# Patient Record
Sex: Female | Born: 1975 | Race: Black or African American | Hispanic: No | Marital: Married | Smoking: Never smoker
Health system: Southern US, Community
[De-identification: ages and names within clinical notes are randomized; demographics above are authoritative.]

## PROBLEM LIST (undated history)

## (undated) DIAGNOSIS — F419 Anxiety disorder, unspecified: Secondary | ICD-10-CM

## (undated) DIAGNOSIS — N644 Mastodynia: Secondary | ICD-10-CM

## (undated) DIAGNOSIS — I8393 Asymptomatic varicose veins of bilateral lower extremities: Secondary | ICD-10-CM

## (undated) DIAGNOSIS — M545 Low back pain, unspecified: Secondary | ICD-10-CM

## (undated) DIAGNOSIS — K219 Gastro-esophageal reflux disease without esophagitis: Secondary | ICD-10-CM

## (undated) DIAGNOSIS — E559 Vitamin D deficiency, unspecified: Secondary | ICD-10-CM

## (undated) HISTORY — DX: Asymptomatic varicose veins of bilateral lower extremities: I83.93

## (undated) HISTORY — DX: Gastro-esophageal reflux disease without esophagitis: K21.9

## (undated) HISTORY — DX: Mastodynia: N64.4

## (undated) HISTORY — DX: Anxiety disorder, unspecified: F41.9

## (undated) HISTORY — DX: Low back pain: M54.5

## (undated) HISTORY — DX: Low back pain, unspecified: M54.50

## (undated) HISTORY — DX: Vitamin D deficiency, unspecified: E55.9

---

## 2005-02-26 ENCOUNTER — Ambulatory Visit: Payer: Self-pay | Admitting: Unknown Physician Specialty

## 2006-02-24 ENCOUNTER — Ambulatory Visit: Payer: Self-pay | Admitting: Family Medicine

## 2006-05-13 ENCOUNTER — Ambulatory Visit (HOSPITAL_COMMUNITY): Admission: RE | Admit: 2006-05-13 | Discharge: 2006-05-13 | Payer: Self-pay | Admitting: Obstetrics

## 2006-10-18 ENCOUNTER — Inpatient Hospital Stay (HOSPITAL_COMMUNITY): Admission: AD | Admit: 2006-10-18 | Discharge: 2006-10-18 | Payer: Self-pay | Admitting: Obstetrics

## 2006-10-23 ENCOUNTER — Inpatient Hospital Stay (HOSPITAL_COMMUNITY): Admission: RE | Admit: 2006-10-23 | Discharge: 2006-10-26 | Payer: Self-pay | Admitting: Obstetrics

## 2007-04-30 IMAGING — US US OB COMP +14 WK
1 series · 13 of 28 positions shown · non-contrast
Comparison: none

CLINICAL DATA: 17 week 4 day gestational age by LMP.  Evaluate dating and anatomy.

[Series 1: us ob comp +14 wk · 0.14mm/px · 13 of 74 slices shown]
[im 3/74]
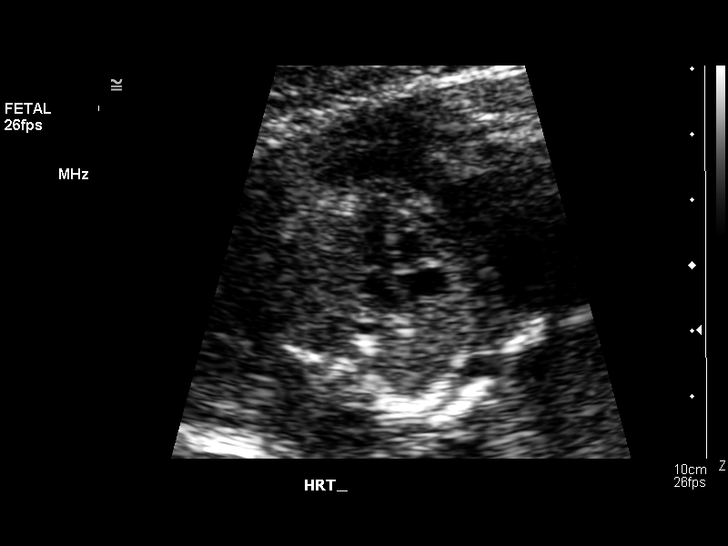
[im 9/74]
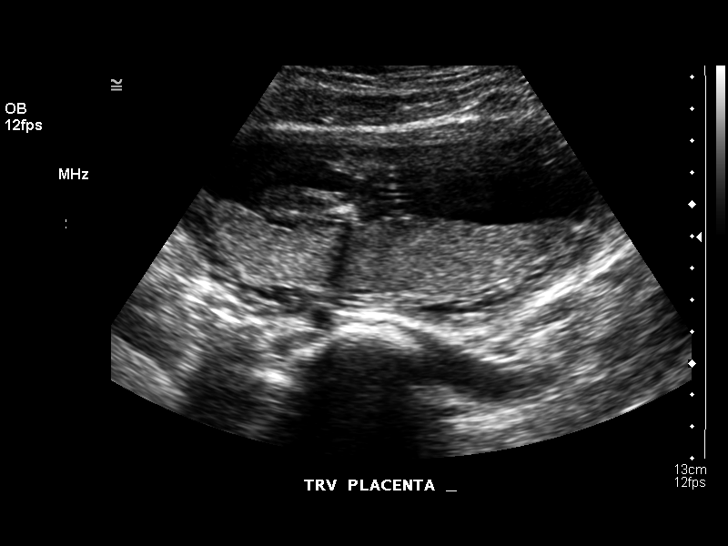
[im 14/74]
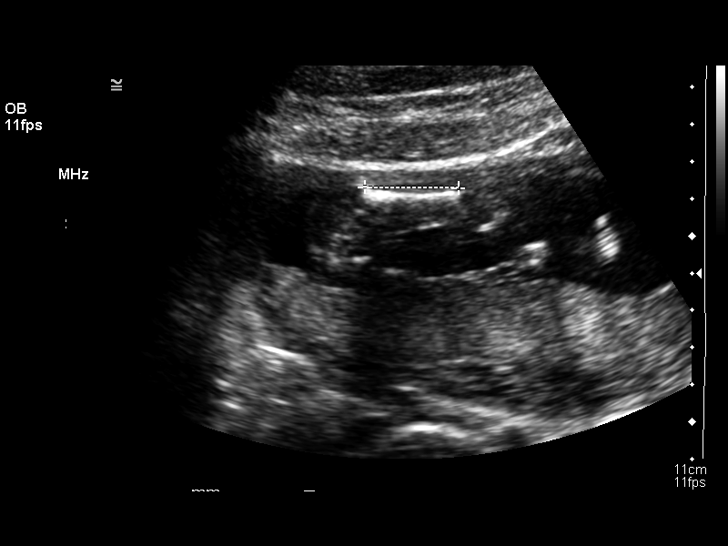
[im 19/74]
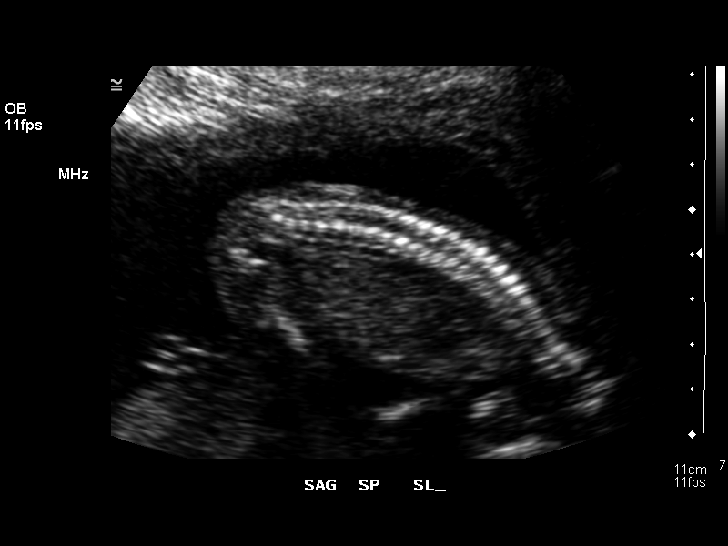
[im 25/74]
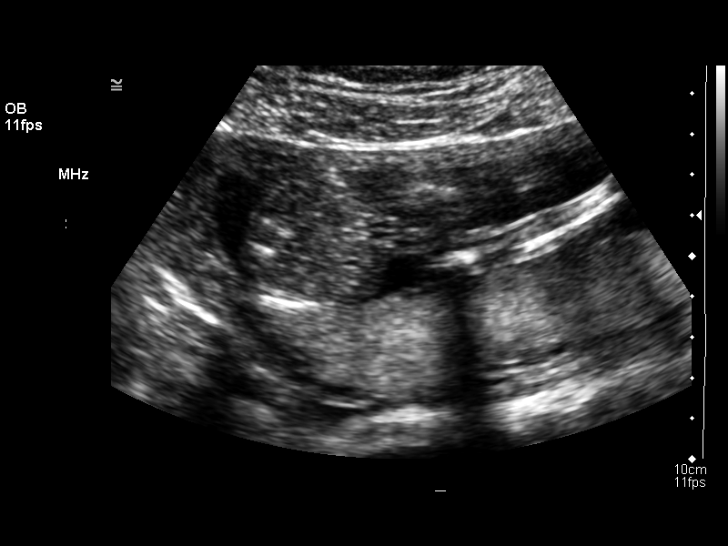
[im 30/74]
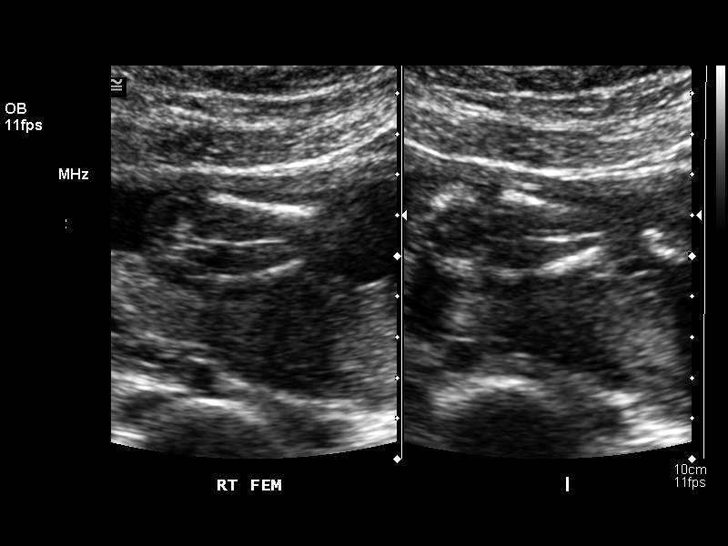
[im 38/74]
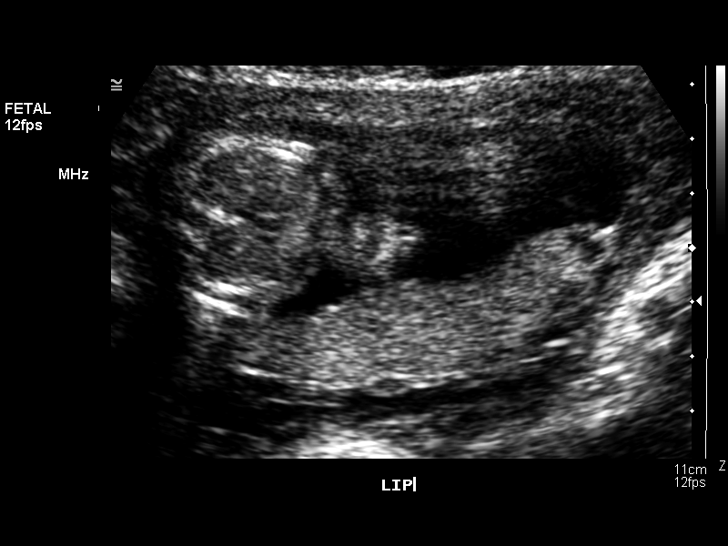
[im 44/74]
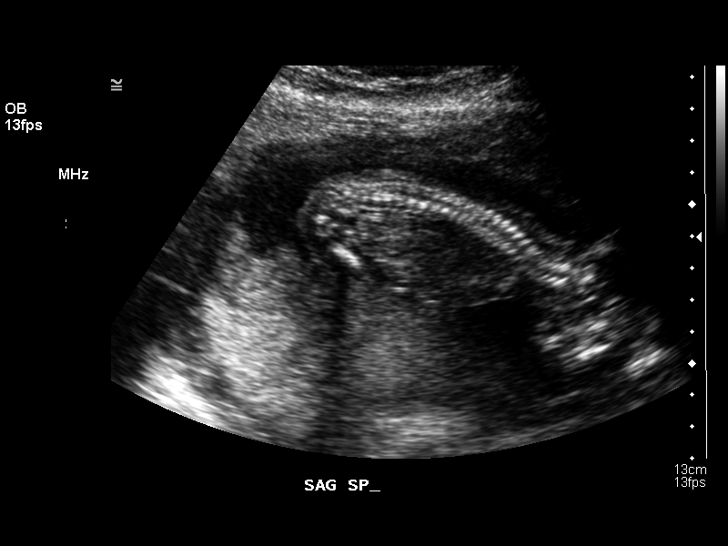
[im 49/74]
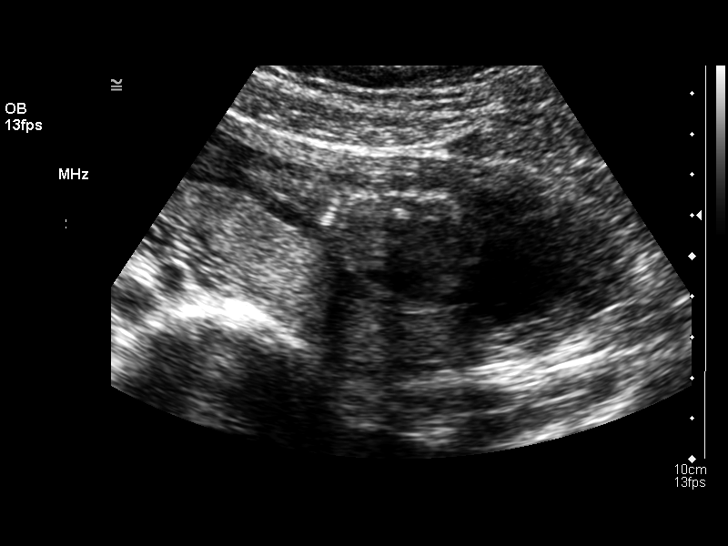
[im 55/74]
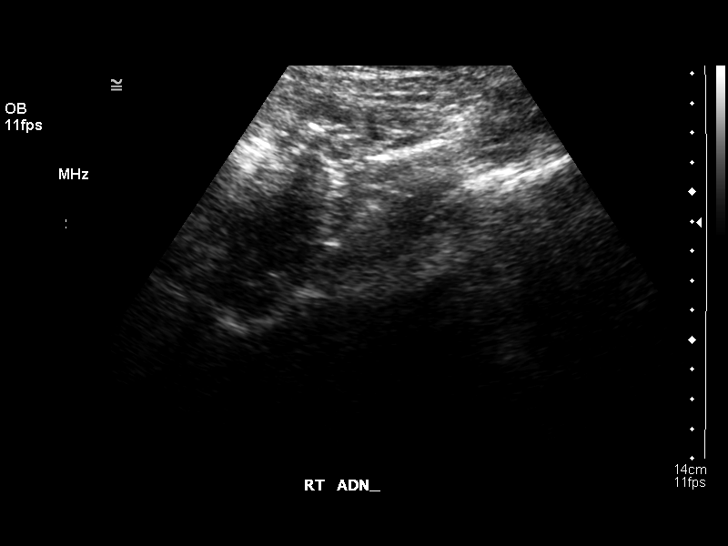
[im 60/74]
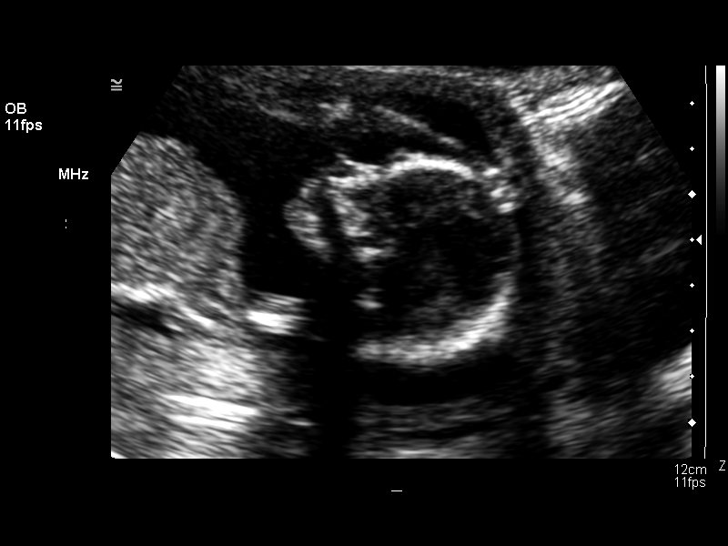
[im 65/74]
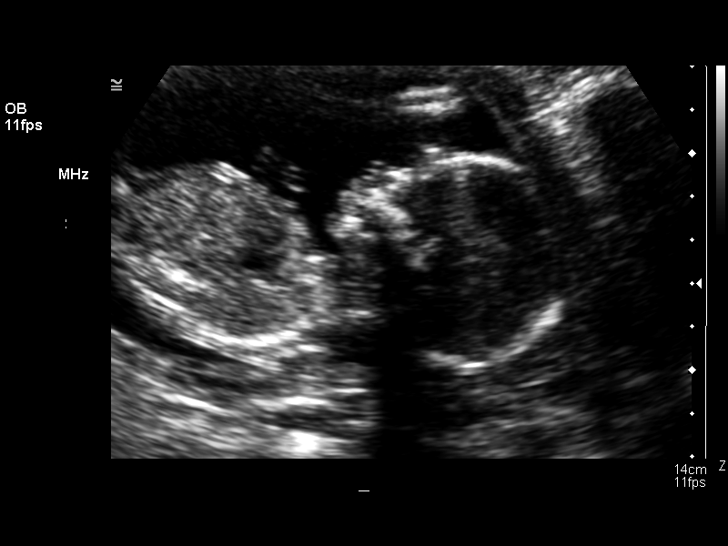
[im 71/74]
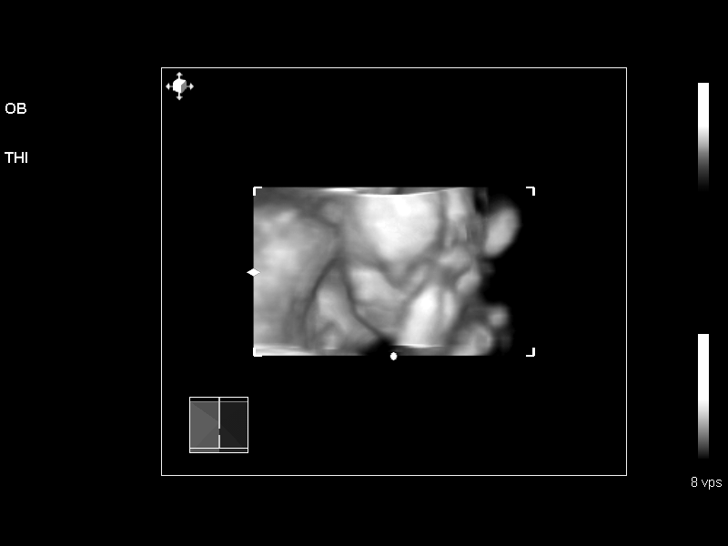

[13 of 28 positions shown; findings below may reference images not displayed]

OBSTETRICAL ULTRASOUND:
 Number of Fetuses: 1
 Heart Rate:  150
 Movement:  Yes
 Breathing:    No  
 Presentation:  Cephalic
 Placental Location:  Posterior
 Grade:  I
 Previa:  No
 Amniotic Fluid (Subjective):  Normal
 Amniotic Fluid (Objective):   3.1 cm Vertical pocket 

 FETAL BIOMETRY
 BPD:   4.0 cm   18 w 0 d
 HC:   15.0 cm   17 w 6 d
 AC:   12.0 cm   17 w 4 d
 FL:    2.6 cm   17 w 6 d

 MEAN GA:  17 w 6 d  US EDC:  10/15/06

 FETAL ANATOMY
 Lateral Ventricles:    Visualized 
 Thalami/CSP:      Visualized 
 Posterior Fossa:  Visualized 
 Nuchal Region: NF= 2.2 mm    Visualized 
 Spine:      Visualized 
 4 Chamber Heart on Left:      Visualized 
 Stomach on Left:      Visualized 
 3 Vessel Cord:    Visualized 
 Cord Insertion site:    Visualized 
 Kidneys:  Visualized 
 Bladder:  Visualized 
 Extremities:      Visualized 

 ADDITIONAL ANATOMY VISUALIZED:  Upper lip, orbits, profile and diaphragm.

 Evaluation limited by: Fetal position.

 MATERNAL UTERINE AND ADNEXAL FINDINGS
 Cervix: 3.2 cm transabdominally
IMPRESSION: 1.  Single living intrauterine fetus with mean gestational age of 17 weeks 6 days and sonographic EDC of 10/15/06.  This is concordant with LMP.  
 2.  No fetal anatomic abnormality identified.

## 2009-04-22 ENCOUNTER — Emergency Department (HOSPITAL_COMMUNITY): Admission: EM | Admit: 2009-04-22 | Discharge: 2009-04-22 | Payer: Self-pay | Admitting: Emergency Medicine

## 2010-09-23 ENCOUNTER — Emergency Department (HOSPITAL_COMMUNITY): Admission: EM | Admit: 2010-09-23 | Discharge: 2010-09-24 | Payer: Self-pay | Admitting: Emergency Medicine

## 2011-01-08 LAB — POCT I-STAT, CHEM 8
BUN: 18 mg/dL (ref 6–23)
Calcium, Ion: 1.19 mmol/L (ref 1.12–1.32)
Chloride: 103 meq/L (ref 96–112)
Creatinine, Ser: 0.8 mg/dL (ref 0.4–1.2)
Glucose, Bld: 93 mg/dL (ref 70–99)
HCT: 40 % (ref 36.0–46.0)
Hemoglobin: 13.6 g/dL (ref 12.0–15.0)
Potassium: 3.3 meq/L — ABNORMAL LOW (ref 3.5–5.1)
Sodium: 142 meq/L (ref 135–145)
TCO2: 27 mmol/L (ref 0–100)

## 2011-01-08 LAB — URINALYSIS, ROUTINE W REFLEX MICROSCOPIC
Bilirubin Urine: NEGATIVE
Ketones, ur: NEGATIVE mg/dL
Leukocytes, UA: NEGATIVE
Nitrite: NEGATIVE
Protein, ur: NEGATIVE mg/dL
Urobilinogen, UA: 1 mg/dL (ref 0.0–1.0)
pH: 6.5 (ref 5.0–8.0)

## 2011-01-08 LAB — GC/CHLAMYDIA PROBE AMP, GENITAL
Chlamydia, DNA Probe: NEGATIVE
GC Probe Amp, Genital: NEGATIVE

## 2011-01-08 LAB — WET PREP, GENITAL
Trich, Wet Prep: NONE SEEN
Yeast Wet Prep HPF POC: NONE SEEN

## 2011-01-08 LAB — POCT PREGNANCY, URINE: Preg Test, Ur: NEGATIVE

## 2011-01-08 LAB — DIFFERENTIAL
Eosinophils Relative: 3 % (ref 0–5)
Lymphocytes Relative: 29 % (ref 12–46)
Lymphs Abs: 2.5 10*3/uL (ref 0.7–4.0)
Monocytes Relative: 7 % (ref 3–12)
Neutrophils Relative %: 61 % (ref 43–77)

## 2011-01-08 LAB — CBC
Hemoglobin: 12 g/dL (ref 12.0–15.0)
MCH: 30.8 pg (ref 26.0–34.0)
MCV: 94.6 fL (ref 78.0–100.0)
Platelets: 240 10*3/uL (ref 150–400)
RBC: 3.9 MIL/uL (ref 3.87–5.11)
WBC: 8.4 10*3/uL (ref 4.0–10.5)

## 2011-01-08 LAB — URINE MICROSCOPIC-ADD ON

## 2011-02-04 LAB — POCT I-STAT, CHEM 8
Calcium, Ion: 1.17 mmol/L (ref 1.12–1.32)
Chloride: 105 mEq/L (ref 96–112)
HCT: 38 % (ref 36.0–46.0)
Potassium: 3.8 mEq/L (ref 3.5–5.1)
Sodium: 140 mEq/L (ref 135–145)

## 2011-02-04 LAB — CBC
MCHC: 33 g/dL (ref 30.0–36.0)
Platelets: 209 10*3/uL (ref 150–400)
RDW: 12.4 % (ref 11.5–15.5)

## 2011-02-04 LAB — DIFFERENTIAL
Basophils Absolute: 0 10*3/uL (ref 0.0–0.1)
Basophils Relative: 0 % (ref 0–1)
Neutro Abs: 3.8 10*3/uL (ref 1.7–7.7)
Neutrophils Relative %: 64 % (ref 43–77)

## 2011-02-04 LAB — POCT CARDIAC MARKERS
CKMB, poc: <1 ng/mL — ABNORMAL LOW (ref 1.0–8.0)
Myoglobin, poc: 36.3 ng/mL (ref 12–200)
Troponin i, poc: <0.05 ng/mL (ref 0.00–0.09)

## 2011-03-20 ENCOUNTER — Ambulatory Visit: Payer: Self-pay | Admitting: Family Medicine

## 2011-04-10 ENCOUNTER — Other Ambulatory Visit (HOSPITAL_COMMUNITY)
Admission: RE | Admit: 2011-04-10 | Discharge: 2011-04-10 | Disposition: A | Discharge status: Home / Self Care | Payer: BC Managed Care – PPO | Source: Ambulatory Visit | Attending: Family Medicine | Admitting: Family Medicine

## 2011-04-10 ENCOUNTER — Encounter: Payer: Self-pay | Admitting: Family Medicine

## 2011-04-10 ENCOUNTER — Ambulatory Visit (INDEPENDENT_AMBULATORY_CARE_PROVIDER_SITE_OTHER): Payer: BC Managed Care – PPO | Admitting: Family Medicine

## 2011-04-10 VITALS — BP 120/72 | HR 72 | Temp 98.6°F | Ht 67.0 in | Wt 162.8 lb

## 2011-04-10 DIAGNOSIS — Z136 Encounter for screening for cardiovascular disorders: Secondary | ICD-10-CM

## 2011-04-10 DIAGNOSIS — Z124 Encounter for screening for malignant neoplasm of cervix: Secondary | ICD-10-CM

## 2011-04-10 DIAGNOSIS — R87810 Cervical high risk human papillomavirus (HPV) DNA test positive: Secondary | ICD-10-CM | POA: Insufficient documentation

## 2011-04-10 DIAGNOSIS — R3129 Other microscopic hematuria: Secondary | ICD-10-CM

## 2011-04-10 DIAGNOSIS — Z113 Encounter for screening for infections with a predominantly sexual mode of transmission: Secondary | ICD-10-CM

## 2011-04-10 DIAGNOSIS — K5900 Constipation, unspecified: Secondary | ICD-10-CM | POA: Insufficient documentation

## 2011-04-10 DIAGNOSIS — Z0000 Encounter for general adult medical examination without abnormal findings: Secondary | ICD-10-CM | POA: Insufficient documentation

## 2011-04-10 DIAGNOSIS — Z01419 Encounter for gynecological examination (general) (routine) without abnormal findings: Secondary | ICD-10-CM | POA: Insufficient documentation

## 2011-04-10 LAB — POCT URINALYSIS DIPSTICK
Glucose, UA: NEGATIVE
Spec Grav, UA: 1.02
Urobilinogen, UA: NEGATIVE
pH, UA: 5

## 2011-04-10 LAB — BASIC METABOLIC PANEL
BUN: 11 mg/dL (ref 6–23)
Chloride: 105 mEq/L (ref 96–112)
GFR: 146.48 mL/min (ref >60.00)
Glucose, Bld: 74 mg/dL (ref 70–99)
Potassium: 4 mEq/L (ref 3.5–5.1)

## 2011-04-10 MED ORDER — NORETHIN ACE-ETH ESTRAD-FE 1-20 MG-MCG(24) PO TABS: 1.0000 | ORAL_TABLET | ORAL | Status: DC

## 2011-04-10 NOTE — Patient Instructions (Signed)
Great to meet you! 

## 2011-04-10 NOTE — Progress Notes (Signed)
35 yo here to establish care without complaints. Wants CPX.  G1P1, no h/o abnormal pap smears. Know h/o microscopic hematuria, per pt neg work up. Denies any dysuria, vaginal discharge or other complaints.  She is interested in restarting OCPs. Sexually active with husband only.  Does not want anything that may stop her from having regular periods. Periods are already pretty light. Got pregnant with her daughter on Nuvaring, does not want that. Gained weight with depo provera.  ROS: See HPI Patient reports no  vision/ hearing changes,anorexia, weight change, fever ,adenopathy, persistant / recurrent hoarseness, swallowing issues, chest pain, edema,persistant / recurrent cough, hemoptysis, dyspnea(rest, exertional, paroxysmal nocturnal), gastrointestinal  bleeding (melena, rectal bleeding), abdominal pain, excessive heart burn, GU symptoms(dysuria, hematuria, pyuria, voiding/incontinence  Issues) syncope, focal weakness, severe memory loss, concerning skin lesions, depression, anxiety, abnormal bruising/bleeding, major joint swelling, breast masses or abnormal vaginal bleeding.    The PMH, PSH, Social History, Family History, Medications, and allergies have been reviewed in [facility], and have been updated if relevant.  Physical exam: BP 120/72  Pulse 72  Temp(Src) 98.6 F (37 C) (Oral)  Ht 5\' 7"  (1.702 m)  Wt 162 lb 12 oz (73.823 kg)  BMI 25.49 kg/m2  LMP 03/20/2011  General:  Well-developed,well-nourished,in no acute distress; alert,appropriate and cooperative throughout examination Head:  normocephalic and atraumatic.   Eyes:  vision grossly intact, pupils equal, pupils round, and pupils reactive to light.   Ears:  R ear normal and L ear normal.   Nose:  no external deformity.   Mouth:  good dentition.   Neck:  No deformities, masses, or tenderness noted. Breasts:  No mass, nodules, thickening, tenderness, bulging, retraction, inflamation, nipple discharge or skin changes noted.     Lungs:  Normal respiratory effort, chest expands symmetrically. Lungs are clear to auscultation, no crackles or wheezes. Heart:  Normal rate and regular rhythm. S1 and S2 normal without gallop, murmur, click, rub or other extra sounds. Abdomen:  Bowel sounds positive,abdomen soft and non-tender without masses, organomegaly or hernias noted. Rectal:  no external abnormalities.   Genitalia:  Pelvic Exam:        External: normal female genitalia without lesions or masses        Vagina: normal without lesions or masses        Cervix: normal without lesions or masses        Adnexa: normal bimanual exam without masses or fullness        Uterus: normal by palpation        Pap smear: performed Msk:  No deformity or scoliosis noted of thoracic or lumbar spine.   Extremities:  No clubbing, cyanosis, edema, or deformity noted with normal full range of motion of all joints.   Neurologic:  alert & oriented X3 and gait normal.   Skin:  Intact without suspicious lesions or rashes Cervical Nodes:  No lymphadenopathy noted Axillary Nodes:  No palpable lymphadenopathy Psych:  Cognition and judgment appear intact. Alert and cooperative with normal attention span and concentration. No apparent delusions, illusions, hallucinations  1. Routine general medical examination at a health care facility  POCT urinalysis dipstick, Basic Metabolic Panel (BMET), Cytology -Pap Smear, Urine Culture  Reviewed preventive care protocols, scheduled due services, and updated immunizations Discussed nutrition, exercise, diet, and healthy lifestyle.  2. Microscopic hematuria   Unchanged per pt. Awaiting records. Send urine for culture to r/o infection.

## 2011-04-11 LAB — HIV ANTIBODY (ROUTINE TESTING W REFLEX): HIV: NONREACTIVE

## 2011-04-12 LAB — URINE CULTURE: Colony Count: NO GROWTH

## 2011-04-16 ENCOUNTER — Telehealth: Payer: Self-pay | Admitting: *Deleted

## 2011-04-16 NOTE — Telephone Encounter (Signed)
[facility] [address] is asking if they can change pt's loestrin to generic.  Faxed request is on your desk.

## 2011-04-17 NOTE — Telephone Encounter (Signed)
Called and left message on pharmacy voicemail advising as instructed via telephone.

## 2011-04-17 NOTE — Telephone Encounter (Signed)
Yes ok to change

## 2011-05-13 ENCOUNTER — Encounter: Payer: Self-pay | Admitting: *Deleted

## 2011-05-13 ENCOUNTER — Ambulatory Visit (INDEPENDENT_AMBULATORY_CARE_PROVIDER_SITE_OTHER): Payer: BC Managed Care – PPO | Admitting: Family Medicine

## 2011-05-13 ENCOUNTER — Encounter: Payer: Self-pay | Admitting: Family Medicine

## 2011-05-13 DIAGNOSIS — J069 Acute upper respiratory infection, unspecified: Secondary | ICD-10-CM

## 2011-05-13 DIAGNOSIS — J029 Acute pharyngitis, unspecified: Secondary | ICD-10-CM

## 2011-05-13 DIAGNOSIS — J020 Streptococcal pharyngitis: Secondary | ICD-10-CM

## 2011-05-13 LAB — POCT RAPID STREP A (OFFICE): Rapid Strep A Screen: POSITIVE — AB

## 2011-05-13 MED ORDER — PENICILLIN G BENZATHINE 1200000 UNIT/2ML IM SUSP
1.2000 10*6.[IU] | Freq: Once | INTRAMUSCULAR | Status: AC
  Administered 2011-05-13: 1.2 10*6.[IU] via INTRAMUSCULAR

## 2011-05-13 NOTE — Progress Notes (Signed)
Victoria Gilmore, a 35 y.o. female presents today in the office for the following:    ST for a few days H/o repetitive strep Lost voice Some cough Some drainage No fever, chills, sweats  No n/v/d No rash  Patient Active Problem List  Diagnoses  . Routine general medical examination at a health care facility  . Constipation  . Microscopic hematuria   Past Medical History  Diagnosis Date  . Hematuria     per pt neg work up   No past surgical history on file. History  Substance Use Topics  . Smoking status: Never Smoker   . Smokeless tobacco: Not on file  . Alcohol Use: Not on file   Family History  Problem Relation Age of Onset  . Hypertension Father    No Known Allergies Current Outpatient Prescriptions on File Prior to Visit  Medication Sig Dispense Refill  . cetirizine (ZYRTEC) 10 MG tablet Take 10 mg by mouth daily.        . Norethin Ace-Eth Estrad-FE 1-20 MG-MCG(24) TABS Take 1 tablet by mouth 1 dose over 24 hours.  28 each  11   ROS: GEN: Acute illness details above GI: Tolerating PO intake GU: maintaining adequate hydration and urination Pulm: No SOB Interactive and getting along well at home.  Otherwise, ROS is as per the HPI.   Physical Exam  Blood pressure 106/60, temperature 98.6 F (37 C), temperature source Oral, weight 160 lb 6.4 oz (72.757 kg).  Gen: WDWN, NAD; A & O x3, cooperative. Pleasant.Globally Non-toxic HEENT: Normocephalic and atraumatic. Throat: swollen tonsills with exudate R TM clear, L TM - good landmarks, No fluid present. rhinnorhea. No frontal or maxillary sinus T. MMM NECK: Anterior cervical  LAD is present - TTP mild CV: RRR, No M/G/R, cap refill <2 sec PULM: Breathing comfortably in no respiratory distress. no wheezing, crackles, rhonchi ABD: S,NT,ND,+BS. No HSM. No rebound. EXT: No c/c/e PSYCH: Friendly, good eye contact MSK: Nml gait  Assessment and Plan: 1.  Acute strep: Bicillin LA 1.2 million units, sx care 2.  URI: concominant URI, supportive care reviewed

## 2012-04-27 ENCOUNTER — Telehealth: Payer: Self-pay | Admitting: Family Medicine

## 2012-04-27 ENCOUNTER — Ambulatory Visit (INDEPENDENT_AMBULATORY_CARE_PROVIDER_SITE_OTHER): Payer: BC Managed Care – PPO | Admitting: Family Medicine

## 2012-04-27 ENCOUNTER — Encounter: Payer: Self-pay | Admitting: Family Medicine

## 2012-04-27 ENCOUNTER — Ambulatory Visit (INDEPENDENT_AMBULATORY_CARE_PROVIDER_SITE_OTHER)
Admission: RE | Admit: 2012-04-27 | Discharge: 2012-04-27 | Disposition: A | Discharge status: Home / Self Care | Payer: BC Managed Care – PPO | Source: Ambulatory Visit | Attending: Family Medicine | Admitting: Family Medicine

## 2012-04-27 VITALS — BP 130/82 | HR 88 | Temp 98.8°F | Wt 166.5 lb

## 2012-04-27 DIAGNOSIS — R079 Chest pain, unspecified: Secondary | ICD-10-CM | POA: Insufficient documentation

## 2012-04-27 DIAGNOSIS — R1032 Left lower quadrant pain: Secondary | ICD-10-CM | POA: Insufficient documentation

## 2012-04-27 DIAGNOSIS — R52 Pain, unspecified: Secondary | ICD-10-CM

## 2012-04-27 LAB — POCT URINALYSIS DIPSTICK
Bilirubin, UA: NEGATIVE
Ketones, UA: 40
Spec Grav, UA: 1.01
pH, UA: 6

## 2012-04-27 LAB — COMPREHENSIVE METABOLIC PANEL
ALT: 21 U/L (ref 0–35)
Alkaline Phosphatase: 46 U/L (ref 39–117)
Sodium: 137 mEq/L (ref 135–145)
Total Bilirubin: 0.4 mg/dL (ref 0.3–1.2)
Total Protein: 7.6 g/dL (ref 6.0–8.3)

## 2012-04-27 LAB — CBC WITH DIFFERENTIAL/PLATELET
Eosinophils Relative: 0.2 % (ref 0.0–5.0)
Monocytes Relative: 5.2 % (ref 3.0–12.0)
Neutrophils Relative %: 77.1 % — ABNORMAL HIGH (ref 43.0–77.0)
Platelets: 199 10*3/uL (ref 150.0–400.0)
WBC: 7.7 10*3/uL (ref 4.5–10.5)

## 2012-04-27 LAB — LIPASE: Lipase: 26 U/L (ref 11.0–59.0)

## 2012-04-27 MED ORDER — IOHEXOL 300 MG/ML  SOLN
100.0000 mL | Freq: Once | INTRAMUSCULAR | Status: AC | PRN
  Administered 2012-04-27: 100 mL via INTRAVENOUS

## 2012-04-27 NOTE — Telephone Encounter (Signed)
Pt evaluated. Labs and imaging ordered.  No evidence of acute abdomen. EKG NSR.

## 2012-04-27 NOTE — Addendum Note (Signed)
Addended by: Consuello Masse on: 04/27/2012 12:35 PM   Modules accepted: Orders

## 2012-04-27 NOTE — Telephone Encounter (Signed)
Caller: Brittanya/Patient; PCP: Ruthe Mannan (Nestor Ramp); CB#: [phone]; ; ; Call regarding Abdominal Pain radiating to Chest, Back and L Shoulder, onset 6-28, pain is worse during the night.  Last BM was 6-30. Pt complains of irregular HB. Pt is tearful. Advised Pt to go to ED d/t Chest Pain radiating and Pt tearful.  Pt would rather come to office first. Chest Pain Protocol used. Appt scheduled on 7-1.  Pt verbalized understanding.

## 2012-04-27 NOTE — Progress Notes (Addendum)
Subjective:    Patient ID: Victoria Gilmore, female    DOB: 1976-03-27, 36 y.o.   MRN: [id number]  HPI  36 yo here for chest pain, left lower quadrant/epigastric abdominal pain for past few days. Had similar episode in 2011- went to ER- cardiac w/u neg.  Abdominal Ct of abd/pelvis neg. Was told she was constipated and started on miralax.  After she started walking more and taking miralax, her pain resolved completely until last few days.  Feels exactly the same as last time.  She has had some constipation but she is still taking miralax.  Last night, pain woke up her and she had palpitations.  Vomited once this morning but she thinks it was from the miralax since she mixed it with lemonade.  No fevers. No blood in stool. No SOB.  Pain actually improves with movement.  Patient Active Problem List  Diagnosis  . Routine general medical examination at a health care facility  . Constipation  . Microscopic hematuria  . Abdominal pain, acute, left lower quadrant  . Chest pain   Past Medical History  Diagnosis Date  . Hematuria     per pt neg work up   No past surgical history on file. History  Substance Use Topics  . Smoking status: Never Smoker   . Smokeless tobacco: Not on file  . Alcohol Use: Yes     Very rare   Family History  Problem Relation Age of Onset  . Hypertension Father    No Known Allergies Current Outpatient Prescriptions on File Prior to Visit  Medication Sig Dispense Refill  . cetirizine (ZYRTEC) 10 MG tablet Take 10 mg by mouth daily.        . Norethin Ace-Eth Estrad-FE 1-20 MG-MCG(24) TABS Take 1 tablet by mouth 1 dose over 24 hours.  28 each  11   The PMH, PSH, Social History, Family History, Medications, and allergies have been reviewed in [facility], and have been updated if relevant.    Review of Systems See HPI    Did have some palpitations this morning  Objective:   Physical Exam BP 130/82  Pulse 88  Temp 98.8 F (37.1 C) (Oral)  Wt 166  lb 8 oz (75.524 kg)  LMP 04/12/2012  General:  Well-developed,well-nourished,in no acute distress; alert,appropriate and cooperative throughout examination Head:  normocephalic and atraumatic.   Eyes:  vision grossly intact, pupils equal, pupils round, and pupils reactive to light.   Ears:  R ear normal and L ear normal.   Nose:  no external deformity.   Mouth:  good dentition.   Neck:  No deformities, masses, or tenderness noted.  Lungs:  Normal respiratory effort, chest expands symmetrically. Lungs are clear to auscultation, no crackles or wheezes. Heart:  Normal rate and regular rhythm. S1 and S2 normal without gallop, murmur, click, rub or other extra sounds. Abdomen: mildly TTP over LLQ,  Bowel sounds positive, no masses, organomegaly or hernias noted. Msk:  No deformity or scoliosis noted of thoracic or lumbar spine.   Extremities:  No clubbing, cyanosis, edema, or deformity noted with normal full range of motion of all joints.   Neurologic:  alert & oriented X3 and gait normal.   Skin:  Intact without suspicious lesions or rashes Psych:  Cognition and judgment appear intact. Alert and cooperative with normal attention span and concentration. No apparent delusions, illusions, hallucinations    EKG- NSR Assessment & Plan:   1. Abdominal pain, acute, left lower quadrant  Deteriorated-  unclear etiology- ? Constipation/reflux. Will start on pepcid, check labs today. Given severity of symptoms (night time awakenings), will check CT of abd/pelvis as well. UA- +mild proteinuria- will recheck at her follow up next week. The patient indicates understanding of these issues and agrees with the plan.     2. Chest pain  Unlikely cardiac- EKG reassuring- NSR. EKG 12-Lead, EKG 12-Lead

## 2012-04-27 NOTE — Patient Instructions (Addendum)
Good to see you. Please stop by to see Asher Muir on your way out. Increase fiber and water, and try over the counter gas-x or beano for bloating.  pepcid 20 mg daily for next 2 weeks. We will call you with your lab results and as soon as I get your CT results.

## 2013-01-13 ENCOUNTER — Ambulatory Visit: Payer: BC Managed Care – PPO | Admitting: Internal Medicine

## 2013-02-05 ENCOUNTER — Telehealth: Payer: Self-pay | Admitting: Emergency Medicine

## 2013-02-05 NOTE — Telephone Encounter (Signed)
If patient was called and advised of appt,. She will be charged a now show fee and can be rescheduled.

## 2013-02-05 NOTE — Telephone Encounter (Signed)
Patient called inquiring about her appointment. She was a no show to this appointment and wasn't sure when it was. She was expecting something in the mail with appointment info which she never received. Can we still make her a new patient appointment since she no showed?

## 2013-02-15 NOTE — Telephone Encounter (Signed)
Per [employer] report the reminder call was answered by machine. I spoke with patient and she said that she didn't get the message it was during the time it was also bad weather and her cell phone was broke. I will send a e-mail to charge correction. I also made her another new patient appointment.

## 2013-05-06 ENCOUNTER — Other Ambulatory Visit (HOSPITAL_COMMUNITY)
Admission: RE | Admit: 2013-05-06 | Discharge: 2013-05-06 | Disposition: A | Discharge status: Home / Self Care | Payer: BC Managed Care – PPO | Source: Ambulatory Visit | Attending: Internal Medicine | Admitting: Internal Medicine

## 2013-05-06 ENCOUNTER — Telehealth: Payer: Self-pay | Admitting: *Deleted

## 2013-05-06 ENCOUNTER — Encounter: Payer: Self-pay | Admitting: Internal Medicine

## 2013-05-06 ENCOUNTER — Ambulatory Visit (INDEPENDENT_AMBULATORY_CARE_PROVIDER_SITE_OTHER): Payer: BC Managed Care – PPO | Admitting: Internal Medicine

## 2013-05-06 VITALS — BP 116/72 | HR 81 | Temp 98.0°F | Resp 14 | Ht 67.0 in | Wt 168.5 lb

## 2013-05-06 DIAGNOSIS — Z13220 Encounter for screening for lipoid disorders: Secondary | ICD-10-CM

## 2013-05-06 DIAGNOSIS — Z1239 Encounter for other screening for malignant neoplasm of breast: Secondary | ICD-10-CM

## 2013-05-06 DIAGNOSIS — Z1159 Encounter for screening for other viral diseases: Secondary | ICD-10-CM

## 2013-05-06 DIAGNOSIS — Z124 Encounter for screening for malignant neoplasm of cervix: Secondary | ICD-10-CM

## 2013-05-06 DIAGNOSIS — E162 Hypoglycemia, unspecified: Secondary | ICD-10-CM

## 2013-05-06 DIAGNOSIS — Z6825 Body mass index (BMI) 25.0-25.9, adult: Secondary | ICD-10-CM

## 2013-05-06 DIAGNOSIS — R3129 Other microscopic hematuria: Secondary | ICD-10-CM

## 2013-05-06 DIAGNOSIS — Z113 Encounter for screening for infections with a predominantly sexual mode of transmission: Secondary | ICD-10-CM

## 2013-05-06 DIAGNOSIS — Z1151 Encounter for screening for human papillomavirus (HPV): Secondary | ICD-10-CM | POA: Insufficient documentation

## 2013-05-06 DIAGNOSIS — R319 Hematuria, unspecified: Secondary | ICD-10-CM

## 2013-05-06 DIAGNOSIS — R5381 Other malaise: Secondary | ICD-10-CM

## 2013-05-06 DIAGNOSIS — E663 Overweight: Secondary | ICD-10-CM

## 2013-05-06 DIAGNOSIS — Z01419 Encounter for gynecological examination (general) (routine) without abnormal findings: Secondary | ICD-10-CM | POA: Insufficient documentation

## 2013-05-06 DIAGNOSIS — Z0000 Encounter for general adult medical examination without abnormal findings: Secondary | ICD-10-CM

## 2013-05-06 DIAGNOSIS — Z862 Personal history of diseases of the blood and blood-forming organs and certain disorders involving the immune mechanism: Secondary | ICD-10-CM

## 2013-05-06 DIAGNOSIS — R5383 Other fatigue: Secondary | ICD-10-CM

## 2013-05-06 DIAGNOSIS — Z8639 Personal history of other endocrine, nutritional and metabolic disease: Secondary | ICD-10-CM

## 2013-05-06 LAB — COMPREHENSIVE METABOLIC PANEL
ALT: 19 U/L (ref 0–35)
BUN: 14 mg/dL (ref 6–23)
CO2: 29 mEq/L (ref 19–32)
Calcium: 8.9 mg/dL (ref 8.4–10.5)
Chloride: 102 mEq/L (ref 96–112)
Creatinine, Ser: 0.7 mg/dL (ref 0.4–1.2)
GFR: 129.69 mL/min (ref >60.00)
Glucose, Bld: 75 mg/dL (ref 70–99)

## 2013-05-06 LAB — CBC WITH DIFFERENTIAL/PLATELET
Eosinophils Relative: 3 % (ref 0.0–5.0)
HCT: 39.5 % (ref 36.0–46.0)
Hemoglobin: 13.2 g/dL (ref 12.0–15.0)
Lymphs Abs: 1.5 10*3/uL (ref 0.7–4.0)
MCV: 94.1 fl (ref 78.0–100.0)
Monocytes Absolute: 0.3 10*3/uL (ref 0.1–1.0)
Neutro Abs: 2.6 10*3/uL (ref 1.4–7.7)
Platelets: 235 10*3/uL (ref 150.0–400.0)
WBC: 4.5 10*3/uL (ref 4.5–10.5)

## 2013-05-06 LAB — LIPID PANEL
Cholesterol: 171 mg/dL (ref 0–200)
HDL: 66.3 mg/dL
LDL Cholesterol: 97 mg/dL (ref 0–99)
Total CHOL/HDL Ratio: 3
Triglycerides: 38 mg/dL (ref 0.0–149.0)
VLDL: 7.6 mg/dL (ref 0.0–40.0)

## 2013-05-06 LAB — URINALYSIS, ROUTINE W REFLEX MICROSCOPIC
Nitrite: NEGATIVE
Urine Glucose: NEGATIVE
Urobilinogen, UA: 0.2 (ref 0.0–1.0)
WBC, UA: NONE SEEN (ref 0–?)

## 2013-05-06 LAB — TSH: TSH: 0.67 u[IU]/mL (ref 0.35–5.50)

## 2013-05-06 NOTE — Progress Notes (Signed)
Patient ID: Victoria Gilmore, female   DOB: [birth year], 37 y.o.   MRN: [id number]    Subjective:     Victoria Gilmore is a 37 y.o. female here for to establish care, requesting an complete physical exam .  Current complaints:    weight gain since 2005.  She lost 52 lbs intentnionally , then got married and has gained back < 20 lb ,  Personal goal is 150 lbs.  She has started a exercise program  a few weeks ago  1 hr 3 times weekly using treadmill and ellipticalmachines along with weight lifting  using low weight reps and abs at home.  2) Hypoglycemia.  She had a recent episode of confusion ,  Not dizzy or nauseated.  Blood sugar was 74 by EMS.  Drank a soda and felt better. Does not drink soda ,  Only lemon water, but has been snacking on junk food late at night while studying.     3) history of persistent hematuria. Prior evaluation by Urology for cystoscopy  which was reportedly normal, along with renal scan. New patient .  No significant PMH, requesting annual exam  Needs PAP smear.  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  Past Medical History  Diagnosis Date  . Hematuria     per pt neg work up     Gynecologic History Patient's last menstrual period was 05/01/2013. Contraception: none Last Pap: 2012. 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   Family History  Problem Relation Age of Onset  . Hypertension Father    The following portions of the patient's history were reviewed and updated as appropriate: allergies, current medications, past family history, past medical history, past social history, past surgical history and problem list. Review of Systems A comprehensive review of systems was negative.    Objective:   BP 116/72  Pulse 81  Temp(Src) 98 F (36.7 C) (Oral)  Resp 14  Ht 5\' 7"  (1.702 m)  Wt 168 lb 8 oz (76.431 kg)  BMI 26.38 kg/m2  SpO2 96%  LMP 05/01/2013  General Appearance:    Alert, cooperative, no distress, appears stated age  Head:     Normocephalic, without obvious abnormality, atraumatic  Eyes:    PERRL, conjunctiva/corneas clear, EOM's intact, fundi    benign, both eyes  Ears:    Normal TM's and external ear canals, both ears  Nose:   Nares normal, septum midline, mucosa normal, no drainage    or sinus tenderness  Throat:   Lips, mucosa, and tongue normal; teeth and gums normal  Neck:   Supple, symmetrical, trachea midline, no adenopathy;    thyroid:  no enlargement/tenderness/nodules; no carotid   bruit or JVD  Back:     Symmetric, no curvature, ROM normal, no CVA tenderness  Lungs:     Clear to auscultation bilaterally, respirations unlabored  Chest Wall:    No tenderness or deformity   Heart:    Regular rate and rhythm, S1 and S2 normal, no murmur, rub   or gallop  Breast Exam:    No tenderness, masses, or nipple abnormality  Abdomen:     Soft, non-tender, bowel sounds active all four quadrants,    no masses, no organomegaly  Genitalia:    Pelvic: cervix normal in appearance, external genitalia normal, no adnexal masses or tenderness, no cervical motion tenderness, rectovaginal septum normal, uterus normal size, shape, and consistency and vagina normal without discharge  Extremities:   Extremities normal, atraumatic, no  cyanosis or edema  Pulses:   2+ and symmetric all extremities  Skin:   Skin color, texture, turgor normal, no rashes or lesions  Lymph nodes:   Cervical, supraclavicular, and axillary nodes normal  Neurologic:   CNII-XII intact, normal strength, sensation and reflexes    throughout    Assessment and Plan:  Routine general medical examination at a health care facility Annual comprehensive exam was done including breast, pelvic and PAP smear. All screenings have been addressed .   Overweight (BMI 25.0-29.9) I have addressed  BMI and recommended a low glycemic index diet utilizing smaller more frequent meals to increase her metabolism.  I have also recommended that patient change her exercise  regimen to  30 minutes of aerobic exercise 5 days per week. Screening for lipid disorders, thyroid and diabetes to be done today.    Microscopic hematuria With prior workup negative for malignancy.  Repeat UA ordered.   History of reactive hypoglycemia Secondary to poor eating habits.  Screening for diavetes.    Updated Medication List Outpatient Encounter Prescriptions as of 05/06/2013  Medication Sig Dispense Refill  . cetirizine (ZYRTEC) 10 MG tablet Take 10 mg by mouth daily.        . [DISCONTINUED] Norethin Ace-Eth Estrad-FE 1-20 MG-MCG(24) TABS Take 1 tablet by mouth 1 dose over 24 hours.  28 each  11   No facility-administered encounter medications on file as of 05/06/2013.

## 2013-05-06 NOTE — Telephone Encounter (Signed)
Pt states she would like to add a HIV test

## 2013-05-06 NOTE — Telephone Encounter (Signed)
Added thanks

## 2013-05-06 NOTE — Patient Instructions (Addendum)
We are screening you today for diabetes, anemia,  Cholesterol, thyroid and hepatitis c (recommendded by the [place] Pre[employer] Services Task Force at least once for all adults )  I recommend following a low glycemic index diet and changing your exercise regiment to shorter workouts and more frequent regimen (30 minute,s 5 days per week) to help you get to your goal of 150 lbs and prevent diabetes  This is Dr. Melina Schools (very strict) version of a  "Low GI"  Diet:  It will still lower your blood sugars and allow you to lose 4 to 8  lbs  per month if you follow it carefully.  Your goal with exercise is a minimum of 30 minutes of aerobic exercise 5 days per week (Walking does not count once it becomes easy!)    All of the foods can be found at grocery stores and in bulk at [employer].  The Atkins protein bars and shakes are available in more varieties at Target, [employer]Mart and [employer].     7 AM Breakfast:  Choose from the following:  Low carbohydrate Protein  Shakes (I recommend the [employer]vantEdge "Carb Control" shakes  Or the low carb shakes by Atkins.    2.5 carbs   Arnold's "Sandwhich Thin"toasted  w/ peanut butter (no jelly: about 20 net carbs  "Bagel Thin" with cream cheese and salmon: about 20 carbs   a scrambled egg/bacon/cheese burrito made with Mission's "carb balance" whole wheat tortilla  (about 10 net carbs )   Avoid cereal and bananas, oatmeal and cream of wheat and grits. They are loaded with carbohydrates!   10 AM: high protein snack  Protein bar by Atkins (the snack size, under 200 cal, usually < 6 net carbs).    A stick of cheese:  Around 1 carb,  100 cal     Dannon Light n Fit [place] Yogurt  (80 cal, 8 carbs)  Other so called "protein bars" and Greek yogurts tend to be loaded with carbohydrates.  Remember, in food advertising, the word "energy" is synonymous for " carbohydrate."  Lunch:   A Sandwich using the bread choices listed, Can use any  Eggs,  lunchmeat, grilled meat or canned  tuna), avocado, regular mayo/mustard  and cheese.  A Salad using blue cheese, ranch,  Goddess or vinagrette,  No croutons or "confetti" and no "candied nuts" but regular nuts OK.   No pretzels or chips.  Pickles and miniature sweet peppers are a good low carb alternative that provide a "crunch"  The bread is the only source of carbohydrate in a sandwich and  can be decreased by trying some of these alternatives to traditional loaf bread  Joseph's makes a pita bread and a flat bread that are 50 cal and 4 net carbs available at BJs and [employer]Mart.  This can be toasted to use with hummous as well  Toufayan makes a low carb flatbread that's 100 cal and 9 net carbs available at [employer] and [employer] makes 2 sizes of  Low carb whole wheat tortilla  (The large one is 210 cal and 6 net carbs) Avoid "Low fat dressings, as well as Reyne Dumas and [address] dressings They are loaded with sugar!   3 PM/ Mid day  Snack:  Consider  1 ounce of  almonds, walnuts, pistachios, pecans, peanuts,  Macadamia nuts or a nut medley.  Avoid "granola"; the dried cranberries and raisins are loaded with carbohydrates. Mixed nuts as long as there are  no raisins,  cranberries or dried fruit.     6 PM  Dinner:     Meat/fowl/fish with a green salad, and either broccoli, cauliflower, green beans, spinach, brussel sprouts or  Lima beans. DO NOT BREAD THE PROTEIN!!      There is a low carb pasta by Dreamfield's that is acceptable and tastes great: only 5 digestible carbs/serving.( All grocery stores but BJs carry it )  Try Kai Levins Angelo's chicken piccata or chicken or eggplant parm over low carb pasta.(Lowes and BJs)   Clifton Custard Sanchez's "Carnitas" (pulled pork, no sauce,  0 carbs) or his beef pot roast to make a dinner burrito (at BJ's)  Pesto over low carb pasta (bj's sells a good quality pesto in the center refrigerated section of the deli   Whole wheat pasta is still full of digestible carbs and  Not as low in glycemic  index as Dreamfield's.   Brown rice is still rice,  So skip the rice and noodles if you eat [place] or [place] (or at least limit to 1/2 cup)  9 PM snack :   Breyer's "low carb" fudgsicle or  ice cream bar (Carb Smart line), or  Weight Watcher's ice cream bar , or another "no sugar added" ice cream;  a serving of fresh berries/cherries with whipped cream   Cheese or DANNON'S LlGHT [employer] FIT [place] YOGURT  Avoid bananas, pineapple, grapes  and watermelon on a regular basis because they are high in sugar.  THINK OF THEM AS DESSERT  Remember that snack Substitutions should be less than 10 NET carbs per serving and meals < 20 carbs. Remember to subtract fiber grams to get the "net carbs."

## 2013-05-07 LAB — HIV ANTIBODY (ROUTINE TESTING W REFLEX): HIV: NONREACTIVE

## 2013-05-08 ENCOUNTER — Encounter: Payer: Self-pay | Admitting: Internal Medicine

## 2013-05-08 DIAGNOSIS — E663 Overweight: Secondary | ICD-10-CM | POA: Insufficient documentation

## 2013-05-08 DIAGNOSIS — Z8639 Personal history of other endocrine, nutritional and metabolic disease: Secondary | ICD-10-CM | POA: Insufficient documentation

## 2013-05-08 NOTE — Assessment & Plan Note (Signed)
Secondary to poor eating habits.  Screening for diavetes.

## 2013-05-08 NOTE — Assessment & Plan Note (Signed)
With prior workup negative for malignancy.  Repeat UA ordered.

## 2013-05-08 NOTE — Assessment & Plan Note (Signed)
I have addressed  BMI and recommended a low glycemic index diet utilizing smaller more frequent meals to increase her metabolism.  I have also recommended that patient change her exercise regimen to  30 minutes of aerobic exercise 5 days per week. Screening for lipid disorders, thyroid and diabetes to be done today.

## 2013-05-08 NOTE — Assessment & Plan Note (Signed)
Annual comprehensive exam was done including breast, pelvic and PAP smear. All screenings have been addressed .  

## 2013-06-02 ENCOUNTER — Ambulatory Visit
Admission: RE | Admit: 2013-06-02 | Discharge: 2013-06-02 | Disposition: A | Discharge status: Home / Self Care | Payer: BC Managed Care – PPO | Source: Ambulatory Visit | Attending: Internal Medicine | Admitting: Internal Medicine

## 2013-06-02 DIAGNOSIS — Z1239 Encounter for other screening for malignant neoplasm of breast: Secondary | ICD-10-CM

## 2013-07-01 ENCOUNTER — Telehealth: Payer: Self-pay | Admitting: *Deleted

## 2013-07-01 NOTE — Telephone Encounter (Signed)
Patient called with general malaise, just not feeling well ask for appointment with you could only come in afternoon gave appointment for 07/09/13 [facility]

## 2013-07-07 LAB — HM MAMMOGRAPHY: HM Mammogram: NORMAL

## 2013-07-09 ENCOUNTER — Ambulatory Visit (INDEPENDENT_AMBULATORY_CARE_PROVIDER_SITE_OTHER): Payer: BC Managed Care – PPO | Admitting: Internal Medicine

## 2013-07-09 ENCOUNTER — Telehealth: Payer: Self-pay | Admitting: Internal Medicine

## 2013-07-09 ENCOUNTER — Encounter: Payer: Self-pay | Admitting: Internal Medicine

## 2013-07-09 VITALS — BP 140/84 | HR 82 | Temp 98.7°F | Resp 14 | Wt 168.0 lb

## 2013-07-09 DIAGNOSIS — Z862 Personal history of diseases of the blood and blood-forming organs and certain disorders involving the immune mechanism: Secondary | ICD-10-CM

## 2013-07-09 DIAGNOSIS — R5381 Other malaise: Secondary | ICD-10-CM

## 2013-07-09 DIAGNOSIS — M542 Cervicalgia: Secondary | ICD-10-CM

## 2013-07-09 DIAGNOSIS — Z8639 Personal history of other endocrine, nutritional and metabolic disease: Secondary | ICD-10-CM

## 2013-07-09 MED ORDER — DOCUSATE SODIUM 50 MG/5ML PO LIQD: ORAL | Status: DC

## 2013-07-09 MED ORDER — OMEPRAZOLE 40 MG PO CPDR: 40.0000 mg | DELAYED_RELEASE_CAPSULE | Freq: Every day | ORAL | Status: DC

## 2013-07-09 NOTE — Telephone Encounter (Signed)
Patient needs an appt with kathy next Thursday at 3:30 pm to have ears irrigated. Staff was gone by time patient was ready for checkout  Please call her to confirm appt thanks

## 2013-07-09 NOTE — Progress Notes (Signed)
Patient ID: Victoria Gilmore, female   DOB: 1976/06/24, 37 y.o.   MRN: [id number]  Patient Active Problem List   Diagnosis Date Noted  . Other malaise and fatigue 07/11/2013  . Neck pain, bilateral posterior 07/11/2013  . Overweight (BMI 25.0-29.9) 05/08/2013  . History of reactive hypoglycemia 05/08/2013  . Routine general medical examination at a health care facility 04/10/2011    Subjective:  CC:   Chief Complaint  Patient presents with  . Follow-up    still having episodes of not feeling well with discomfort in neck and shoulders.    HPI:   Victoria Gilmore a 37 y.o. female who presents "Not feeling well"  Having recurrent fleeting episodes of feeling off balance, accompanied by nausea without emesis or diarrhea.  Occurs with mild activity, not with exercise. Not accompanied by Vision shudders "like you're going over a bump" , the feelings last a couple of seconds and are not associated with a headache but make her feel panicky   2) Neck and shoulder pain nonradiating , worse in the morning , aggravated by hours spent in front of her laptop.    Past Medical History  Diagnosis Date  . Hematuria     per pt neg work up    History reviewed. No pertinent past surgical history.     The following portions of the patient's history were reviewed and updated as appropriate: Allergies, current medications, and problem list.    Review of Systems:   12 Pt  review of systems was negative except those addressed in the HPI,     History   Social History  . Marital Status: Married    Spouse Name: N/A    Number of Children: N/A  . Years of Education: N/A   Occupational History  . Not on file.   Social History Main Topics  . Smoking status: Never Smoker   . Smokeless tobacco: Never Used  . Alcohol Use: Yes     Comment: Very rare  . Drug Use: No  . Sexual Activity: Yes   Other Topics Concern  . Not on file   Social History Narrative    graduated with  Bachelor's degree in public health 2014 from [employer].    Objective:  Filed Vitals:   07/09/13 1638  BP: 140/84  Pulse: 82  Temp: 98.7 F (37.1 C)  Resp: 14     General appearance: alert, cooperative and appears stated age Ears: normal TM's and external ear canals both ears Throat: lips, mucosa, and tongue normal; teeth and gums normal Neck: no adenopathy, no carotid bruit, supple, symmetrical, trachea midline and thyroid not enlarged, symmetric, no tenderness/mass/nodules Back: symmetric, no curvature. ROM normal. No CVA tenderness. Lungs: clear to auscultation bilaterally Heart: regular rate and rhythm, S1, S2 normal, no murmur, click, rub or gallop Abdomen: soft, non-tender; bowel sounds normal; no masses,  no organomegaly Pulses: 2+ and symmetric Skin: Skin color, texture, turgor normal. No rashes or lesions Lymph nodes: Cervical, supraclavicular, and axillary nodes normal.  Assessment and Plan:  Other malaise and fatigue etiology unclear, since there are no palpitations associated with these very brief episodes and she was screened after last visit for metabolic disturbances and has no signs of hypertension or diabetes.  Given that she had a reactive  Hypoglycemic event in the past, I have given her a glucose meter to test her CBG the next time she has an episode.,  Reassurance provided. Marland Kitchen   History of reactive hypoglycemia Secondary  to poor eating habits.  I have addressed  BMI and recommended a low glycemic index diet utilizing smaller more frequent meals to increase metabolism.  I have also recommended that patient start continue  with a goal of 30 minutes of aerobic exercise a minimum of 5 days per week. Screening for lipid disorders, thyroid and diabetes  Was negative.   Neck pain, bilateral posterior Exam is notable for mild scoliosis; symptoms aggravated by long periods of work at the computer.  Recommended frequent breaks,  Exercises to strengthen shoulders.     Updated Medication List Outpatient Encounter Prescriptions as of 07/09/2013  Medication Sig Dispense Refill  . cetirizine (ZYRTEC) 10 MG tablet Take 10 mg by mouth daily.        Marland Kitchen docusate (COLACE) 50 MG/5ML liquid 3 or 4  Drops in affected ear daily  120 mL  0  . omeprazole (PRILOSEC) 40 MG capsule Take 1 capsule (40 mg total) by mouth daily.  30 capsule  3   No facility-administered encounter medications on file as of 07/09/2013.     Orders Placed This Encounter  Procedures  . HM MAMMOGRAPHY  . HM PAP SMEAR    No Follow-up on file.

## 2013-07-09 NOTE — Patient Instructions (Addendum)
Your symptoms may be due to an inner ear /Eustachian tube problem.  Try flushing your sinuses twice daily with simply saline   Use the debrox or the liquid colace every night and return for an irrigation next Thursday afternoon   Check your blood sugar the next time you have an episode   I want you to take a proton inhibitor for your stomach daily .  Will call in omeprazole to your pharmacy

## 2013-07-11 ENCOUNTER — Encounter: Payer: Self-pay | Admitting: Internal Medicine

## 2013-07-11 DIAGNOSIS — M542 Cervicalgia: Secondary | ICD-10-CM | POA: Insufficient documentation

## 2013-07-11 DIAGNOSIS — R5381 Other malaise: Secondary | ICD-10-CM | POA: Insufficient documentation

## 2013-07-11 NOTE — Assessment & Plan Note (Signed)
Secondary to poor eating habits.  I have addressed  BMI and recommended a low glycemic index diet utilizing smaller more frequent meals to increase metabolism.  I have also recommended that patient start continue  with a goal of 30 minutes of aerobic exercise a minimum of 5 days per week. Screening for lipid disorders, thyroid and diabetes  Was negative.

## 2013-07-11 NOTE — Assessment & Plan Note (Signed)
Exam is notable for mild scoliosis; symptoms aggravated by long periods of work at the computer.  Recommended frequent breaks,  Exercises to strengthen shoulders.

## 2013-07-11 NOTE — Assessment & Plan Note (Signed)
etiology unclear, since there are no palpitations associated with these very brief episodes and she was screened after last visit for metabolic disturbances and has no signs of hypertension or diabetes.  Given that she had a reactive  Hypoglycemic event in the past, I have given her a glucose meter to test her CBG the next time she has an episode.,  Reassurance provided. Marland Kitchen

## 2013-07-12 NOTE — Telephone Encounter (Signed)
Pt aware.

## 2013-07-12 NOTE — Telephone Encounter (Signed)
Appointment made

## 2013-07-15 ENCOUNTER — Ambulatory Visit: Payer: BC Managed Care – PPO

## 2013-07-28 ENCOUNTER — Ambulatory Visit: Payer: BC Managed Care – PPO

## 2013-08-11 ENCOUNTER — Ambulatory Visit: Payer: BC Managed Care – PPO

## 2013-09-20 ENCOUNTER — Telehealth: Payer: Self-pay | Admitting: *Deleted

## 2013-09-20 NOTE — Telephone Encounter (Signed)
Patient paper work in [employer]

## 2013-09-20 NOTE — Telephone Encounter (Signed)
Noted, i will look for it her Dr. Elmer Sow inbox

## 2014-10-31 ENCOUNTER — Telehealth: Payer: Self-pay

## 2014-10-31 ENCOUNTER — Emergency Department (HOSPITAL_COMMUNITY)
Admission: EM | Admit: 2014-10-31 | Discharge: 2014-10-31 | Disposition: A | Discharge status: Home / Self Care | Payer: BC Managed Care – PPO | Source: Home / Self Care | Attending: Family Medicine | Admitting: Family Medicine

## 2014-10-31 ENCOUNTER — Encounter (HOSPITAL_COMMUNITY): Payer: Self-pay | Admitting: Emergency Medicine

## 2014-10-31 DIAGNOSIS — R002 Palpitations: Secondary | ICD-10-CM

## 2014-10-31 NOTE — Telephone Encounter (Signed)
The patient called, and was transferred to the triage line.  They tried calling her back, but were unsuccessful.  She called in this afternoon and stated this morning she had symptoms of dizzyness, blurred vision, sweating, and anxiety.  She was driving and pulled over until her symptoms resolved.   She stated she is no longer having these symptoms, but still wants to be evaluated.  I offered her an appointment for tomorrow (1/5) at [facility], but she declined stating she could not come in until Thursday.  She is scheduled for Thursday, with R.Baity at [facility].  (there is a triage note on file, this is just an [place])

## 2014-10-31 NOTE — ED Provider Notes (Signed)
Victoria Gilmore is a 39 y.o. female who presents to Urgent Care today for palpitations. Patient felt palpitations and anxiety while driving to work this morning. She pulled the car over and felt better after about 30 seconds. Her symptoms have not recurred today. She denies any chest pain lightheadedness or weakness. She has similar episode of palpitations about 2 weeks ago. No fevers or chills vomiting or diarrhea.   Past Medical History  Diagnosis Date  . Hematuria     per pt neg work up   No past surgical history on file. History  Substance Use Topics  . Smoking status: Never Smoker   . Smokeless tobacco: Never Used  . Alcohol Use: Yes     Comment: Very rare   ROS as above Medications: No current facility-administered medications for this encounter.   Current Outpatient Prescriptions  Medication Sig Dispense Refill  . cetirizine (ZYRTEC) 10 MG tablet Take 10 mg by mouth daily.      Marland Kitchen docusate (COLACE) 50 MG/5ML liquid 3 or 4  Drops in affected ear daily 120 mL 0  . omeprazole (PRILOSEC) 40 MG capsule Take 1 capsule (40 mg total) by mouth daily. 30 capsule 3   No Known Allergies   Exam:  BP 128/84 mmHg  Pulse 92  Temp(Src) 97.2 F (36.2 C) (Oral)  Resp 18  SpO2 100% Gen: Well NAD HEENT: EOMI,  MMM cerumen impaction bilaterally Lungs: Normal work of breathing. CTABL Heart: RRR no MRG Abd: NABS, Soft. Nondistended, Nontender Exts: Brisk capillary refill, warm and well perfused.   Twelve-lead EKG sinus tachycardia at a rate of 101 bpm. No ST segment elevation or depression. Normal T waves. QTC 451. 1 minute rhythm strip without arrhythmia.  No results found for this or any previous visit (from the past 24 hour(s)). No results found.  Assessment and Plan: 39 y.o. female with  Palpitations: Clear anxiety. Patient is mildly tachycardic now. Her heart rate was normal earlier. I suspect patient is mildly anxious. Recommend follow-up with PCP. Precautions  reviewed. Additionally patient has a cerumen impaction bilaterally. Recommend Debrox eardrops over-the-counter and follow-up with PCP.   Discussed warning signs or symptoms. Please see discharge instructions. Patient expresses understanding.     Rodolph Bong, MD 10/31/14 2025

## 2014-10-31 NOTE — ED Notes (Addendum)
Reports 2 episodes recently of heart racing.  One episode a few weeks ago.  With both episodes she had little sleep prior to episodes.  At times has trouble focusing.  Denies drinking "a lot" of caffeine items.  Denies taking any cold medicines recently.  Sometimes she feels lightheaded followed by anxiety

## 2014-10-31 NOTE — Telephone Encounter (Signed)
Talked with patient and was advised she also felt her chest pounding and 8 hours later she was still not herself but better advised patient I thought she should go to ER to be evaluated patient stated this happen on way to work and that she was going to advise her husband but was going to wait to be seen on Thursday at [facility] office. Tried several times to advise patient she should be evaluated especially due to the fact she did not feel like herself 8 hours later still not able to focus clearly. Patient stated she did not think she would go to ER but that she would consider after talking with husband. FYI

## 2014-10-31 NOTE — Discharge Instructions (Signed)
Thank you for coming in today. Follow-up with primary care provider regarding palpitations and ear complaint Come back as needed If you get worse go to the emergency room  Call or go to the emergency room if you get worse, have trouble breathing, have chest pains, or palpitations.    Palpitations A palpitation is the feeling that your heartbeat is irregular or is faster than normal. It may feel like your heart is fluttering or skipping a beat. Palpitations are usually not a serious problem. However, in some cases, you may need further medical evaluation. CAUSES  Palpitations can be caused by:  Smoking.  Caffeine or other stimulants, such as diet pills or energy drinks.  Alcohol.  Stress and anxiety.  Strenuous physical activity.  Fatigue.  Certain medicines.  Heart disease, especially if you have a history of irregular heart rhythms (arrhythmias), such as atrial fibrillation, atrial flutter, or supraventricular tachycardia.  An improperly working pacemaker or defibrillator. DIAGNOSIS  To find the cause of your palpitations, your health care provider will take your medical history and perform a physical exam. Your health care provider may also have you take a test called an ambulatory electrocardiogram (ECG). An ECG records your heartbeat patterns over a 24-hour period. You may also have other tests, such as:  Transthoracic echocardiogram (TTE). During echocardiography, sound waves are used to evaluate how blood flows through your heart.  Transesophageal echocardiogram (TEE).  Cardiac monitoring. This allows your health care provider to monitor your heart rate and rhythm in real time.  Holter monitor. This is a portable device that records your heartbeat and can help diagnose heart arrhythmias. It allows your health care provider to track your heart activity for several days, if needed.  Stress tests by exercise or by giving medicine that makes the heart beat  faster. TREATMENT  Treatment of palpitations depends on the cause of your symptoms and can vary greatly. Most cases of palpitations do not require any treatment other than time, relaxation, and monitoring your symptoms. Other causes, such as atrial fibrillation, atrial flutter, or supraventricular tachycardia, usually require further treatment. HOME CARE INSTRUCTIONS   Avoid:  Caffeinated coffee, tea, soft drinks, diet pills, and energy drinks.  Chocolate.  Alcohol.  Stop smoking if you smoke.  Reduce your stress and anxiety. Things that can help you relax include:  A method of controlling things in your body, such as your heartbeats, with your mind (biofeedback).  Yoga.  Meditation.  Physical activity such as swimming, jogging, or walking.  Get plenty of rest and sleep. SEEK MEDICAL CARE IF:   You continue to have a fast or irregular heartbeat beyond 24 hours.  Your palpitations occur more often. SEEK IMMEDIATE MEDICAL CARE IF:  You have chest pain or shortness of breath.  You have a severe headache.  You feel dizzy or you faint. MAKE SURE YOU:  Understand these instructions.  Will watch your condition.  Will get help right away if you are not doing well or get worse. Document Released: 10/11/2000 Document Revised: 10/19/2013 Document Reviewed: 12/13/2011 [facility] Patient Information 2015 [place], [place]. This information is not intended to replace advice given to you by your health care provider. Make sure you discuss any questions you have with your health care provider.

## 2014-11-02 ENCOUNTER — Telehealth: Payer: Self-pay | Admitting: Internal Medicine

## 2014-11-02 ENCOUNTER — Telehealth: Payer: Self-pay

## 2014-11-02 NOTE — Telephone Encounter (Signed)
See previous phone notes have tried to contact patient she has no voicemail set-up I cannot leave message. Patient needs follow up from ED with PCP.

## 2014-11-02 NOTE — Telephone Encounter (Signed)
Tried to reach patient to follow up on ED visit, patient has no voicemail set up . Patient previous phone call to office was after 4 pm will try again to reach patient around 4 PM, trying to reschedule patient with this office.

## 2014-11-02 NOTE — Telephone Encounter (Signed)
The patient called and wanted to cancel her apt for R.Baity on Thursday (1/7).  She is hoping to speak directly to [place] as soon as possible.

## 2014-11-02 NOTE — Telephone Encounter (Signed)
Patient is scheduled acute on Monday due to ear pain and dizziness.

## 2014-11-03 ENCOUNTER — Ambulatory Visit: Payer: BC Managed Care – PPO | Admitting: Internal Medicine

## 2014-11-04 NOTE — Telephone Encounter (Signed)
Patient had follow up scheduled with PCP on Monday which had to be cancelled due to PCP is out of office. Patient wanted nurse to irrigate ears with out seeing physician advised patient I could not perform irrigation without MD or NP approval patient refused.  Patient stated she had her sister whom is RN partially clean ears but was not able to finish because of Improper or lack of equipment. Advised patient not to risk infection she should be seen patient refused stating she is not going to pay for another OV to see MD.

## 2014-11-05 ENCOUNTER — Encounter (HOSPITAL_COMMUNITY): Payer: Self-pay | Admitting: *Deleted

## 2014-11-05 ENCOUNTER — Emergency Department (HOSPITAL_COMMUNITY): Payer: BC Managed Care – PPO

## 2014-11-05 ENCOUNTER — Emergency Department (HOSPITAL_COMMUNITY)
Admission: EM | Admit: 2014-11-05 | Discharge: 2014-11-05 | Disposition: A | Discharge status: Home / Self Care | Payer: BC Managed Care – PPO | Attending: Emergency Medicine | Admitting: Emergency Medicine

## 2014-11-05 DIAGNOSIS — F419 Anxiety disorder, unspecified: Secondary | ICD-10-CM | POA: Diagnosis not present

## 2014-11-05 DIAGNOSIS — R0789 Other chest pain: Secondary | ICD-10-CM | POA: Diagnosis not present

## 2014-11-05 DIAGNOSIS — H6123 Impacted cerumen, bilateral: Secondary | ICD-10-CM | POA: Diagnosis not present

## 2014-11-05 DIAGNOSIS — Z79899 Other long term (current) drug therapy: Secondary | ICD-10-CM | POA: Diagnosis not present

## 2014-11-05 DIAGNOSIS — R079 Chest pain, unspecified: Secondary | ICD-10-CM

## 2014-11-05 LAB — CBC
HCT: 38.4 % (ref 36.0–46.0)
HEMOGLOBIN: 12.5 g/dL (ref 12.0–15.0)
MCH: 30.1 pg (ref 26.0–34.0)
MCHC: 32.6 g/dL (ref 30.0–36.0)
MCV: 92.5 fL (ref 78.0–100.0)
PLATELETS: 231 10*3/uL (ref 150–400)
RBC: 4.15 MIL/uL (ref 3.87–5.11)
RDW: 12.2 % (ref 11.5–15.5)
WBC: 6.8 10*3/uL (ref 4.0–10.5)

## 2014-11-05 LAB — BASIC METABOLIC PANEL
Anion gap: 5 (ref 5–15)
BUN: 11 mg/dL (ref 6–23)
CALCIUM: 9.3 mg/dL (ref 8.4–10.5)
CO2: 27 mmol/L (ref 19–32)
Chloride: 107 mEq/L (ref 96–112)
Creatinine, Ser: 0.67 mg/dL (ref 0.50–1.10)
GFR calc Af Amer: >90 mL/min (ref >90)
GLUCOSE: 108 mg/dL — AB (ref 70–99)
Potassium: 3.4 mmol/L — ABNORMAL LOW (ref 3.5–5.1)
SODIUM: 139 mmol/L (ref 135–145)

## 2014-11-05 LAB — I-STAT TROPONIN, ED: Troponin i, poc: 0 ng/mL (ref 0.00–0.08)

## 2014-11-05 MED ORDER — DOCUSATE SODIUM 50 MG/5ML PO LIQD
50.0000 mg | Freq: Once | ORAL | Status: AC
  Administered 2014-11-05: 50 mg via OTIC
  Filled 2014-11-05: qty 10

## 2014-11-05 MED ORDER — LORAZEPAM 1 MG PO TABS
0.5000 mg | ORAL_TABLET | Freq: Once | ORAL | Status: AC
  Administered 2014-11-05: 0.5 mg via ORAL
  Filled 2014-11-05: qty 1

## 2014-11-05 MED ORDER — LORAZEPAM 1 MG PO TABS: 0.5000 mg | ORAL_TABLET | Freq: Three times a day (TID) | ORAL | Status: DC | PRN

## 2014-11-05 NOTE — ED Notes (Signed)
Pt in c/o chest pain since last night, pain has been intermittent, also reports recent episodes of dizziness and went to urgent care for that and dx with ear problems, pt states she has had some trouble driving recently also- states she isn't sure if its anxiety but she has been having trouble focusing on the road. Today reports another episode of dizziness. Chest pain is worse when taking a deep breath or with movement, today noticed left arm heaviness during pain episodes

## 2014-11-05 NOTE — Discharge Instructions (Signed)
1. Medications: ativan, usual home medications 2. Treatment: rest, drink plenty of fluids,  3. Follow Up: Please followup with your primary doctor in 3 days for discussion of your diagnoses and further evaluation after today's visit; if you do not have a primary care doctor use the resource guide provided to find one; Please return to the ER for worsening chest pain or CP associated with nausea, diaphoresis or syncope   Generalized Anxiety Disorder Generalized anxiety disorder (GAD) is a mental disorder. It interferes with life functions, including relationships, work, and school. GAD is different from normal anxiety, which everyone experiences at some point in their lives in response to specific life events and activities. Normal anxiety actually helps us prepare for and get through these life events and activities. Normal anxiety goes away after the event or activity is over.  GAD causes anxiety that is not necessarily related to specific events or activities. It also causes excess anxiety in proportion to specific events or activities. The anxiety associated with GAD is also difficult to control. GAD can vary from mild to severe. People with severe GAD can have intense waves of anxiety with physical symptoms (panic attacks).  SYMPTOMS The anxiety and worry associated with GAD are difficult to control. This anxiety and worry are related to many life events and activities and also occur more days than not for 6 months or longer. People with GAD also have three or more of the following symptoms (one or more in children):  Restlessness.   Fatigue.  Difficulty concentrating.   Irritability.  Muscle tension.  Difficulty sleeping or unsatisfying sleep. DIAGNOSIS GAD is diagnosed through an assessment by your health care provider. Your health care provider will ask you questions aboutyour mood,physical symptoms, and events in your life. Your health care provider may ask you about your medical  history and use of alcohol or drugs, including prescription medicines. Your health care provider may also do a physical exam and blood tests. Certain medical conditions and the use of certain substances can cause symptoms similar to those associated with GAD. Your health care provider may refer you to a mental health specialist for further evaluation. TREATMENT The following therapies are usually used to treat GAD:   Medication. Antidepressant medication usually is prescribed for long-term daily control. Antianxiety medicines may be added in severe cases, especially when panic attacks occur.   Talk therapy (psychotherapy). Certain types of talk therapy can be helpful in treating GAD by providing support, education, and guidance. A form of talk therapy called cognitive behavioral therapy can teach you healthy ways to think about and react to daily life events and activities.  Stress managementtechniques. These include yoga, meditation, and exercise and can be very helpful when they are practiced regularly. A mental health specialist can help determine which treatment is best for you. Some people see improvement with one therapy. However, other people require a combination of therapies. Document Released: 02/08/2013 Document Revised: 02/28/2014 Document Reviewed: 02/08/2013 [facility] Patient Information 2015 [place], [place]. This information is not intended to replace advice given to you by your health care provider. Make sure you discuss any questions you have with your health care provider.

## 2014-11-05 NOTE — ED Provider Notes (Signed)
CSN: [id number]     Arrival date & time 11/05/14  1527 History   First MD Initiated Contact with Patient 11/05/14 1614     Chief Complaint  Patient presents with  . Chest Pain     (Consider location/radiation/quality/duration/timing/severity/associated sxs/prior Treatment) The history is provided by the patient and medical records. No language interpreter was used.     Victoria Gilmore is a 39 y.o. female  with no major medical Hx presents to the Emergency Department complaining intermittent feelings of anxiety onset several weeks ago with increasing episodes in the last week. Last night, she developed some chest pain with associated left scapular pain that was worse with movement, palpation and deep inspiration.  She reports this is significantly improved. Patient reports that she's been under a significant amount of stress lately. She reports she feels anxious regularly. She reports that she recently had to switch cars and every time she drives her SUV she has a panic attack.  She describes this as having trouble focusing on the road with chest palpitations, associated lightheadedness and hyperventilation.   Patient reports no personal or family cardiac history. She reports no medications for her symptoms prior to arrival. Patient reports that she was seen on 10/31/2014 after one of these episodes that this was the same day she returned to school after Christmas break and was feeling particularly stressed on the way to work.  She denies associated nausea, vision changes, diaphoresis, shortness of breath, syncope or near syncope.  She also denies fever, chills, headache, neck pain, abdominal pain, nausea, vomiting, diarrhea, weakness, dizziness, syncope. Last menstrual period now.  Patient also reports that she has felt off balance recently and was diagnosed with a bilateral cerumen impaction at urgent care on 10/31/2014.  She reports using the Debrox at home 1 without relief.  Past Medical  History  Diagnosis Date  . Hematuria     per pt neg work up   History reviewed. No pertinent past surgical history. Family History  Problem Relation Age of Onset  . Hypertension Father    History  Substance Use Topics  . Smoking status: Never Smoker   . Smokeless tobacco: Never Used  . Alcohol Use: Yes     Comment: Very rare   OB History    No data available     Review of Systems  Constitutional: Negative for fever, diaphoresis, appetite change, fatigue and unexpected weight change.  HENT: Negative for mouth sores.   Eyes: Negative for visual disturbance.  Respiratory: Negative for cough, chest tightness, shortness of breath and wheezing.   Cardiovascular: Positive for chest pain.  Gastrointestinal: Negative for nausea, vomiting, abdominal pain, diarrhea and constipation.  Endocrine: Negative for polydipsia, polyphagia and polyuria.  Genitourinary: Negative for dysuria, urgency, frequency and hematuria.  Musculoskeletal: Negative for back pain and neck stiffness.  Skin: Negative for rash.  Allergic/Immunologic: Negative for immunocompromised state.  Neurological: Negative for syncope, light-headedness and headaches.  Hematological: Does not bruise/bleed easily.  Psychiatric/Behavioral: Negative for sleep disturbance. The patient is nervous/anxious.       Allergies  Review of patient's allergies indicates no known allergies.  Home Medications   Prior to Admission medications   Medication Sig Start Date End Date Taking? Authorizing Provider  cetirizine (ZYRTEC) 10 MG tablet Take 10 mg by mouth as needed for allergies.    Yes Historical Provider, MD  ibuprofen (ADVIL,MOTRIN) 200 MG tablet Take 400 mg by mouth every 6 (six) hours as needed for moderate pain.  Yes Historical Provider, MD  OVER THE COUNTER MEDICATION Apply 1 application topically daily as needed (for ringworm).    Yes Historical Provider, MD  LORazepam (ATIVAN) 1 MG tablet Take 0.5 tablets (0.5 mg total)  by mouth every 8 (eight) hours as needed for anxiety. 11/05/14   Drexler Maland, PA-C  omeprazole (PRILOSEC) 40 MG capsule Take 1 capsule (40 mg total) by mouth daily. 07/09/13   Sherlene Shams, MD   BP 117/77 mmHg  Pulse 79  Temp(Src) 98.5 F (36.9 C) (Oral)  Resp 19  Ht  (1.702 m)  Wt 180 lb (81.647 kg)  BMI 28.19 kg/m2  SpO2 99%  LMP 10/10/2014 Physical Exam  Constitutional: She is oriented to person, place, and time. She appears well-developed and well-nourished. No distress.  Awake, alert, nontoxic appearance  HENT:  Head: Normocephalic and atraumatic.  Right Ear: External ear normal.  Left Ear: External ear normal.  Nose: Nose normal. No epistaxis. Right sinus exhibits no maxillary sinus tenderness and no frontal sinus tenderness. Left sinus exhibits no maxillary sinus tenderness and no frontal sinus tenderness.  Mouth/Throat: Uvula is midline, oropharynx is clear and moist and mucous membranes are normal. Mucous membranes are not pale and not cyanotic. No oropharyngeal exudate, posterior oropharyngeal edema, posterior oropharyngeal erythema or tonsillar abscesses.  Bilateral cerumen impaction occluding the canal  Eyes: Conjunctivae are normal. Pupils are equal, round, and reactive to light. No scleral icterus.  Neck: Normal range of motion and full passive range of motion without pain. Neck supple.  Cardiovascular: Normal rate, regular rhythm, normal heart sounds and intact distal pulses.   No murmur heard. Pulmonary/Chest: Effort normal and breath sounds normal. No stridor. No tachypnea. No respiratory distress. She has no decreased breath sounds. She has no wheezes. She has no rhonchi. She exhibits tenderness.  Equal chest expansion Consistently reproducible pain with palpation to the left chest wall and inferior border of the left scapula  Abdominal: Soft. Bowel sounds are normal. She exhibits no distension and no mass. There is no tenderness. There is no rebound and no  guarding.  Soft and nontender  Musculoskeletal: Normal range of motion. She exhibits no edema.  Lymphadenopathy:    She has no cervical adenopathy.  Neurological: She is alert and oriented to person, place, and time. She exhibits normal muscle tone. Coordination normal.  Speech is clear and goal oriented Moves extremities without ataxia  Skin: Skin is warm and dry. No rash noted. She is not diaphoretic.  Psychiatric: She has a normal mood and affect.  Nursing note and vitals reviewed.   ED Course  EAR CERUMEN REMOVAL Date/Time: 11/05/2014 9:09 PM Performed by: Dierdre Forth Authorized by: Dierdre Forth Consent: Verbal consent obtained. Risks and benefits: risks, benefits and alternatives were discussed Consent given by: patient Patient understanding: patient states understanding of the procedure being performed Patient consent: the patient's understanding of the procedure matches consent given Procedure consent: procedure consent matches procedure scheduled Relevant documents: relevant documents present and verified Site marked: the operative site was marked Required items: required blood products, implants, devices, and special equipment available Patient identity confirmed: verbally with patient and arm band Time out: Immediately prior to procedure a "time out" was called to verify the correct patient, procedure, equipment, support staff and site/side marked as required. Local anesthetic: none Ceruminolytics applied: Ceruminolytics applied prior to the procedure. Location details: right ear Procedure type: curette and irrigation Patient sedated: no Patient tolerance: Patient tolerated the procedure well with no immediate complications  EAR CERUMEN REMOVAL Date/Time: 11/05/2014 9:09 PM Performed by: Dierdre ForthMUTHERSBAUGH, Josanna Hefel Authorized by: Dierdre ForthMUTHERSBAUGH, Idolina Mantell Consent: Verbal consent obtained. Risks and benefits: risks, benefits and alternatives were discussed Consent  given by: patient Patient understanding: patient states understanding of the procedure being performed Patient consent: the patient's understanding of the procedure matches consent given Procedure consent: procedure consent matches procedure scheduled Relevant documents: relevant documents present and verified Site marked: the operative site was marked Required items: required blood products, implants, devices, and special equipment available Patient identity confirmed: verbally with patient and arm band Time out: Immediately prior to procedure a "time out" was called to verify the correct patient, procedure, equipment, support staff and site/side marked as required. Local anesthetic: none Ceruminolytics applied: Ceruminolytics applied prior to the procedure. Location details: left ear Procedure type: curette and irrigation Patient sedated: no Patient tolerance: Patient tolerated the procedure well with no immediate complications   (including critical care time) Labs Review Labs Reviewed  BASIC METABOLIC PANEL - Abnormal; Notable for the following:    Potassium 3.4 (*)    Glucose, Bld 108 (*)    All other components within normal limits  CBC  I-STAT TROPOININ, ED    Imaging Review Dg Chest 2 View  11/05/2014   CLINICAL DATA:  Central region chest pain for 2 days  EXAM: CHEST  2 VIEW  COMPARISON:  April 22, 2009  FINDINGS: Lungs are clear. Heart size and pulmonary vascularity are normal. No adenopathy. No pneumothorax. There is slight upper thoracic levoscoliosis.  IMPRESSION: No edema or consolidation.   Electronically Signed   By: Bretta BangWilliam  Woodruff M.D.   On: 11/05/2014 16:27     EKG Interpretation None      MDM   Final diagnoses:  Chest pain  Anxiety  Cerumen impaction, bilateral  Chest wall pain   Zabria J Devin presents with recent episodes of anxiety and reproducible chest pain. On exam patient also with bilateral cerumen impaction. She request assistance with  this. We'll obtain workup.  Patient's symptoms are likely to be anxiety. Highly doubt cardiac etiology. Will attempt cerumen disimpaction and give Ativan.  9:08 PM Cerumen removal from bilateral ears. Patient reports complete resolution of her chest pain and anxiety symptoms.  Patient presents to the emergency department complaining of symptoms consistent with anxiety.  Patient has a history of same with similar episodes.  The patient is resting comfortably, in no apparent distress and asymptomatic.  Labs, ECG and vital signs reviewed.  No exophthalmos, no symptoms of UTI.  Stress reducing mechanisms discussed including caffeine intake.  Patient has been referred to psychiatric services and PCP for follow-up.  Discharged with a 3 day prescription for Ativan 1mg .    I have personally reviewed patient's vitals, nursing note and any pertinent labs or imaging.  I performed an undressed physical exam.    It has been determined that no acute conditions requiring further emergency intervention are present at this time. The patient/guardian have been advised of the diagnosis and plan. I reviewed all labs and imaging including any potential incidental findings. We have discussed signs and symptoms that warrant return to the ED and they are listed in the discharge instructions.    Vital signs are stable at discharge.   BP 117/77 mmHg  Pulse 79  Temp(Src) 98.5 F (36.9 C) (Oral)  Resp 19  Ht 5\' 7"  (1.702 m)  Wt 180 lb (81.647 kg)  BMI 28.19 kg/m2  SpO2 99%  LMP 10/10/2014  Dahlia Client Nesiah Jump, PA-C 11/05/14 2115  Juliet Rude. Rubin Payor, MD 11/06/14 [id number]

## 2014-11-07 ENCOUNTER — Ambulatory Visit: Payer: BC Managed Care – PPO | Admitting: Internal Medicine

## 2015-05-25 ENCOUNTER — Encounter (INDEPENDENT_AMBULATORY_CARE_PROVIDER_SITE_OTHER): Payer: Self-pay

## 2015-05-25 ENCOUNTER — Ambulatory Visit (INDEPENDENT_AMBULATORY_CARE_PROVIDER_SITE_OTHER): Payer: BC Managed Care – PPO | Admitting: Internal Medicine

## 2015-05-25 ENCOUNTER — Encounter: Payer: Self-pay | Admitting: Internal Medicine

## 2015-05-25 VITALS — BP 108/74 | HR 84 | Temp 97.7°F | Resp 16 | Ht 67.0 in | Wt 175.8 lb

## 2015-05-25 DIAGNOSIS — E663 Overweight: Secondary | ICD-10-CM

## 2015-05-25 DIAGNOSIS — Z0000 Encounter for general adult medical examination without abnormal findings: Secondary | ICD-10-CM | POA: Diagnosis not present

## 2015-05-25 DIAGNOSIS — H8110 Benign paroxysmal vertigo, unspecified ear: Secondary | ICD-10-CM

## 2015-05-25 DIAGNOSIS — R5383 Other fatigue: Secondary | ICD-10-CM

## 2015-05-25 DIAGNOSIS — F410 Panic disorder [episodic paroxysmal anxiety] without agoraphobia: Secondary | ICD-10-CM | POA: Diagnosis not present

## 2015-05-25 DIAGNOSIS — E785 Hyperlipidemia, unspecified: Secondary | ICD-10-CM | POA: Diagnosis not present

## 2015-05-25 LAB — COMPREHENSIVE METABOLIC PANEL
ALK PHOS: 52 U/L (ref 39–117)
ALT: 11 U/L (ref 0–35)
AST: 17 U/L (ref 0–37)
Albumin: 3.8 g/dL (ref 3.5–5.2)
BUN: 10 mg/dL (ref 6–23)
CO2: 25 mEq/L (ref 19–32)
CREATININE: 0.63 mg/dL (ref 0.40–1.20)
Calcium: 9 mg/dL (ref 8.4–10.5)
Chloride: 107 mEq/L (ref 96–112)
GFR: 135.35 mL/min (ref >60.00)
Glucose, Bld: 79 mg/dL (ref 70–99)
Potassium: 3.8 mEq/L (ref 3.5–5.1)
Sodium: 137 mEq/L (ref 135–145)
Total Bilirubin: 0.4 mg/dL (ref 0.2–1.2)
Total Protein: 7.1 g/dL (ref 6.0–8.3)

## 2015-05-25 LAB — LIPID PANEL
CHOLESTEROL: 139 mg/dL (ref 0–200)
HDL: 51.5 mg/dL (ref >39.00)
LDL Cholesterol: 80 mg/dL (ref 0–99)
NONHDL: 87.18
Total CHOL/HDL Ratio: 3
Triglycerides: 34 mg/dL (ref 0.0–149.0)
VLDL: 6.8 mg/dL (ref 0.0–40.0)

## 2015-05-25 LAB — CBC WITH DIFFERENTIAL/PLATELET
BASOS ABS: 0 10*3/uL (ref 0.0–0.1)
Basophils Relative: 0.4 % (ref 0.0–3.0)
Eosinophils Absolute: 0.1 10*3/uL (ref 0.0–0.7)
Eosinophils Relative: 1.7 % (ref 0.0–5.0)
HCT: 38.8 % (ref 36.0–46.0)
HEMOGLOBIN: 12.8 g/dL (ref 12.0–15.0)
LYMPHS ABS: 1.5 10*3/uL (ref 0.7–4.0)
Lymphocytes Relative: 27.3 % (ref 12.0–46.0)
MCHC: 32.9 g/dL (ref 30.0–36.0)
MCV: 94.7 fl (ref 78.0–100.0)
MONO ABS: 0.4 10*3/uL (ref 0.1–1.0)
Monocytes Relative: 7 % (ref 3.0–12.0)
Neutro Abs: 3.5 10*3/uL (ref 1.4–7.7)
Neutrophils Relative %: 63.6 % (ref 43.0–77.0)
Platelets: 230 10*3/uL (ref 150.0–400.0)
RBC: 4.09 Mil/uL (ref 3.87–5.11)
RDW: 13 % (ref 11.5–15.5)
WBC: 5.4 10*3/uL (ref 4.0–10.5)

## 2015-05-25 LAB — TSH: TSH: 1.5 u[IU]/mL (ref 0.35–4.50)

## 2015-05-25 MED ORDER — LORAZEPAM 0.5 MG PO TABS: 0.5000 mg | ORAL_TABLET | Freq: Three times a day (TID) | ORAL | Status: DC | PRN

## 2015-05-25 MED ORDER — FLUTICASONE PROPIONATE 50 MCG/ACT NA SUSP: 2.0000 | Freq: Every day | NASAL | Status: DC

## 2015-05-25 NOTE — Patient Instructions (Addendum)
Victoria Gilmore's extension is 106 .     I would use Debrox in each ear, alternating nights ,  To keep the ear wax down.  You can tyr using the steroid nasal spray to see if it helps your vertigo  Panic Attacks Panic attacks are sudden, short-livedsurges of severe anxiety, fear, or discomfort. They may occur for no reason when you are relaxed, when you are anxious, or when you are sleeping. Panic attacks may occur for a number of reasons:   Healthy people occasionally have panic attacks in extreme, life-threatening situations, such as war or natural disasters. Normal anxiety is a protective mechanism of the body that helps [place] react to danger (fight or flight response).  Panic attacks are often seen with anxiety disorders, such as panic disorder, social anxiety disorder, generalized anxiety disorder, and phobias. Anxiety disorders cause excessive or uncontrollable anxiety. They may interfere with your relationships or other life activities.  Panic attacks are sometimes seen with other mental illnesses, such as depression and posttraumatic stress disorder.  Certain medical conditions, prescription medicines, and drugs of abuse can cause panic attacks. SYMPTOMS  Panic attacks start suddenly, peak within 20 minutes, and are accompanied by four or more of the following symptoms:  Pounding heart or fast heart rate (palpitations).  Sweating.  Trembling or shaking.  Shortness of breath or feeling smothered.  Feeling choked.  Chest pain or discomfort.  Nausea or strange feeling in your stomach.  Dizziness, light-headedness, or feeling like you will faint.  Chills or hot flushes.  Numbness or tingling in your lips or hands and feet.  Feeling that things are not real or feeling that you are not yourself.  Fear of losing control or going crazy.  Fear of dying. Some of these symptoms can mimic serious medical conditions. For example, you may think you are having a heart attack. Although panic  attacks can be very scary, they are not life threatening. DIAGNOSIS  Panic attacks are diagnosed through an assessment by your health care provider. Your health care provider will ask questions about your symptoms, such as where and when they occurred. Your health care provider will also ask about your medical history and use of alcohol and drugs, including prescription medicines. Your health care provider may order blood tests or other studies to rule out a serious medical condition. Your health care provider may refer you to a mental health professional for further evaluation. TREATMENT   Most healthy people who have one or two panic attacks in an extreme, life-threatening situation will not require treatment.  The treatment for panic attacks associated with anxiety disorders or other mental illness typically involves counseling with a mental health professional, medicine, or a combination of both. Your health care provider will help determine what treatment is best for you.  Panic attacks due to physical illness usually go away with treatment of the illness. If prescription medicine is causing panic attacks, talk with your health care provider about stopping the medicine, decreasing the dose, or substituting another medicine.  Panic attacks due to alcohol or drug abuse go away with abstinence. Some adults need professional help in order to stop drinking or using drugs. HOME CARE INSTRUCTIONS   Take all medicines as directed by your health care provider.   Schedule and attend follow-up visits as directed by your health care provider. It is important to keep all your appointments. SEEK MEDICAL CARE IF:  You are not able to take your medicines as prescribed.  Your symptoms do  not improve or get worse. SEEK IMMEDIATE MEDICAL CARE IF:   You experience panic attack symptoms that are different than your usual symptoms.  You have serious thoughts about hurting yourself or others.  You are  taking medicine for panic attacks and have a serious side effect. MAKE SURE YOU:  Understand these instructions.  Will watch your condition.  Will get help right away if you are not doing well or get worse. Document Released: 10/14/2005 Document Revised: 10/19/2013 Document Reviewed: 05/28/2013 [facility] Patient Information 2015 [place], [place]. This information is not intended to replace advice given to you by your health care provider. Make sure you discuss any questions you have with your health care provider.  Benign Positional Vertigo Vertigo means you feel like you or your surroundings are moving when they are not. Benign positional vertigo is the most common form of vertigo. Benign means that the cause of your condition is not serious. Benign positional vertigo is more common in older adults. CAUSES  Benign positional vertigo is the result of an upset in the labyrinth system. This is an area in the middle ear that helps control your balance. This may be caused by a viral infection, head injury, or repetitive motion. However, often no specific cause is found. SYMPTOMS  Symptoms of benign positional vertigo occur when you move your head or eyes in different directions. Some of the symptoms may include:  Loss of balance and falls.  Vomiting.  Blurred vision.  Dizziness.  Nausea.  Involuntary eye movements (nystagmus). DIAGNOSIS  Benign positional vertigo is usually diagnosed by physical exam. If the specific cause of your benign positional vertigo is unknown, your caregiver may perform imaging tests, such as magnetic resonance imaging (MRI) or computed tomography (CT). TREATMENT  Your caregiver may recommend movements or procedures to correct the benign positional vertigo. Medicines such as meclizine, benzodiazepines, and medicines for nausea may be used to treat your symptoms. In rare cases, if your symptoms are caused by certain conditions that affect the inner ear, you may need  surgery. HOME CARE INSTRUCTIONS   Follow your caregiver's instructions.  Move slowly. Do not make sudden body or head movements.  Avoid driving.  Avoid operating heavy machinery.  Avoid performing any tasks that would be dangerous to you or others during a vertigo episode.  Drink enough fluids to keep your urine clear or pale yellow. SEEK IMMEDIATE MEDICAL CARE IF:   You develop problems with walking, weakness, numbness, or using your arms, hands, or legs.  You have difficulty speaking.  You develop severe headaches.  Your nausea or vomiting continues or gets worse.  You develop visual changes.  Your family or friends notice any behavioral changes.  Your condition gets worse.  You have a fever.  You develop a stiff neck or sensitivity to light. MAKE SURE YOU:   Understand these instructions.  Will watch your condition.  Will get help right away if you are not doing well or get worse. Document Released: 07/22/2006 Document Revised: 01/06/2012 Document Reviewed: 07/04/2011 [facility] Patient Information 2015 [place], [place]. This information is not intended to replace advice given to you by your health care provider. Make sure you discuss any questions you have with your health care provider.  Health Maintenance Adopting a healthy lifestyle and getting preventive care can go a long way to promote health and wellness. Talk with your health care provider about what schedule of regular examinations is right for you. This is a good chance for you to check  in with your provider about disease prevention and staying healthy. In between checkups, there are plenty of things you can do on your own. Experts have done a lot of research about which lifestyle changes and preventive measures are most likely to keep you healthy. Ask your health care provider for more information. WEIGHT AND DIET  Eat a healthy diet  Be sure to include plenty of vegetables, fruits, low-fat dairy products,  and lean protein.  Do not eat a lot of foods high in solid fats, added sugars, or salt.  Get regular exercise. This is one of the most important things you can do for your health.  Most adults should exercise for at least 150 minutes each week. The exercise should increase your heart rate and make you sweat (moderate-intensity exercise).  Most adults should also do strengthening exercises at least twice a week. This is in addition to the moderate-intensity exercise.  Maintain a healthy weight  Body mass index (BMI) is a measurement that can be used to identify possible weight problems. It estimates body fat based on height and weight. Your health care provider can help determine your BMI and help you achieve or maintain a healthy weight.  For females 34 years of age and older:   A BMI below 18.5 is considered underweight.  A BMI of 18.5 to 24.9 is normal.  A BMI of 25 to 29.9 is considered overweight.  A BMI of 30 and above is considered obese.  Watch levels of cholesterol and blood lipids  You should start having your blood tested for lipids and cholesterol at 39 years of age, then have this test every 5 years.  You may need to have your cholesterol levels checked more often if:  Your lipid or cholesterol levels are high.  You are older than 39 years of age.  You are at high risk for heart disease.  CANCER SCREENING   Lung Cancer  Lung cancer screening is recommended for adults 37-49 years old who are at high risk for lung cancer because of a history of smoking.  A yearly low-dose CT scan of the lungs is recommended for people who:  Currently smoke.  Have quit within the past 15 years.  Have at least a 30-pack-year history of smoking. A pack year is smoking an average of one pack of cigarettes a day for 1 year.  Yearly screening should continue until it has been 15 years since you quit.  Yearly screening should stop if you develop a health problem that would  prevent you from having lung cancer treatment.  Breast Cancer  Practice breast self-awareness. This means understanding how your breasts normally appear and feel.  It also means doing regular breast self-exams. Let your health care provider know about any changes, no matter how small.  If you are in your 20s or 30s, you should have a clinical breast exam (CBE) by a health care provider every 1-3 years as part of a regular health exam.  If you are 63 or older, have a CBE every year. Also consider having a breast X-ray (mammogram) every year.  If you have a family history of breast cancer, talk to your health care provider about genetic screening.  If you are at high risk for breast cancer, talk to your health care provider about having an MRI and a mammogram every year.  Breast cancer gene (BRCA) assessment is recommended for women who have family members with BRCA-related cancers. BRCA-related cancers include:  Breast.  Ovarian.  Tubal.  Peritoneal cancers.  Results of the assessment will determine the need for genetic counseling and BRCA1 and BRCA2 testing. Cervical Cancer Routine pelvic examinations to screen for cervical cancer are no longer recommended for nonpregnant women who are considered low risk for cancer of the pelvic organs (ovaries, uterus, and vagina) and who do not have symptoms. A pelvic examination may be necessary if you have symptoms including those associated with pelvic infections. Ask your health care provider if a screening pelvic exam is right for you.   The Pap test is the screening test for cervical cancer for women who are considered at risk.  If you had a hysterectomy for a problem that was not cancer or a condition that could lead to cancer, then you no longer need Pap tests.  If you are older than 65 years, and you have had normal Pap tests for the past 10 years, you no longer need to have Pap tests.  If you have had past treatment for cervical  cancer or a condition that could lead to cancer, you need Pap tests and screening for cancer for at least 20 years after your treatment.  If you no longer get a Pap test, assess your risk factors if they change (such as having a new sexual partner). This can affect whether you should start being screened again.  Some women have medical problems that increase their chance of getting cervical cancer. If this is the case for you, your health care provider may recommend more frequent screening and Pap tests.  The human papillomavirus (HPV) test is another test that may be used for cervical cancer screening. The HPV test looks for the virus that can cause cell changes in the cervix. The cells collected during the Pap test can be tested for HPV.  The HPV test can be used to screen women 70 years of age and older. Getting tested for HPV can extend the interval between normal Pap tests from three to five years.  An HPV test also should be used to screen women of any age who have unclear Pap test results.  After 39 years of age, women should have HPV testing as often as Pap tests.  Colorectal Cancer  This type of cancer can be detected and often prevented.  Routine colorectal cancer screening usually begins at 39 years of age and continues through 39 years of age.  Your health care provider may recommend screening at an earlier age if you have risk factors for colon cancer.  Your health care provider may also recommend using home test kits to check for hidden blood in the stool.  A small camera at the end of a tube can be used to examine your colon directly (sigmoidoscopy or colonoscopy). This is done to check for the earliest forms of colorectal cancer.  Routine screening usually begins at age 43.  Direct examination of the colon should be repeated every 5-10 years through 39 years of age. However, you may need to be screened more often if early forms of precancerous polyps or small growths are  found. Skin Cancer  Check your skin from head to toe regularly.  Tell your health care provider about any new moles or changes in moles, especially if there is a change in a mole's shape or color.  Also tell your health care provider if you have a mole that is larger than the size of a pencil eraser.  Always use sunscreen. Apply sunscreen liberally and repeatedly  throughout the day.  Protect yourself by wearing long sleeves, pants, a wide-brimmed hat, and sunglasses whenever you are outside. HEART DISEASE, DIABETES, AND HIGH BLOOD PRESSURE   Have your blood pressure checked at least every 1-2 years. High blood pressure causes heart disease and increases the risk of stroke.  If you are between 64 years and 43 years old, ask your health care provider if you should take aspirin to prevent strokes.  Have regular diabetes screenings. This involves taking a blood sample to check your fasting blood sugar level.  If you are at a normal weight and have a low risk for diabetes, have this test once every three years after 39 years of age.  If you are overweight and have a high risk for diabetes, consider being tested at a younger age or more often. PREVENTING INFECTION  Hepatitis B  If you have a higher risk for hepatitis B, you should be screened for this virus. You are considered at high risk for hepatitis B if:  You were born in a country where hepatitis B is common. Ask your health care provider which countries are considered high risk.  Your parents were born in a high-risk country, and you have not been immunized against hepatitis B (hepatitis B vaccine).  You have HIV or AIDS.  You use needles to inject street drugs.  You live with someone who has hepatitis B.  You have had sex with someone who has hepatitis B.  You get hemodialysis treatment.  You take certain medicines for conditions, including cancer, organ transplantation, and autoimmune conditions. Hepatitis C  Blood  testing is recommended for:  Everyone born from [birth year] through 1965.  Anyone with known risk factors for hepatitis C. Sexually transmitted infections (STIs)  You should be screened for sexually transmitted infections (STIs) including gonorrhea and chlamydia if:  You are sexually active and are younger than 39 years of age.  You are older than 39 years of age and your health care provider tells you that you are at risk for this type of infection.  Your sexual activity has changed since you were last screened and you are at an increased risk for chlamydia or gonorrhea. Ask your health care provider if you are at risk.  If you do not have HIV, but are at risk, it may be recommended that you take a prescription medicine daily to prevent HIV infection. This is called pre-exposure prophylaxis (PrEP). You are considered at risk if:  You are sexually active and do not regularly use condoms or know the HIV status of your partner(s).  You take drugs by injection.  You are sexually active with a partner who has HIV. Talk with your health care provider about whether you are at high risk of being infected with HIV. If you choose to begin PrEP, you should first be tested for HIV. You should then be tested every 3 months for as long as you are taking PrEP.  PREGNANCY   If you are premenopausal and you may become pregnant, ask your health care provider about preconception counseling.  If you may become pregnant, take 400 to 800 micrograms (mcg) of folic acid every day.  If you want to prevent pregnancy, talk to your health care provider about birth control (contraception). OSTEOPOROSIS AND MENOPAUSE   Osteoporosis is a disease in which the bones lose minerals and strength with aging. This can result in serious bone fractures. Your risk for osteoporosis can be identified using a bone density scan.  If you are 44 years of age or older, or if you are at risk for osteoporosis and fractures, ask your  health care provider if you should be screened.  Ask your health care provider whether you should take a calcium or vitamin D supplement to lower your risk for osteoporosis.  Menopause may have certain physical symptoms and risks.  Hormone replacement therapy may reduce some of these symptoms and risks. Talk to your health care provider about whether hormone replacement therapy is right for you.  HOME CARE INSTRUCTIONS   Schedule regular health, dental, and eye exams.  Stay current with your immunizations.   Do not use any tobacco products including cigarettes, chewing tobacco, or electronic cigarettes.  If you are pregnant, do not drink alcohol.  If you are breastfeeding, limit how much and how often you drink alcohol.  Limit alcohol intake to no more than 1 drink per day for nonpregnant women. One drink equals 12 ounces of beer, 5 ounces of wine, or 1 ounces of hard liquor.  Do not use street drugs.  Do not share needles.  Ask your health care provider for help if you need support or information about quitting drugs.  Tell your health care provider if you often feel depressed.  Tell your health care provider if you have ever been abused or do not feel safe at home. Document Released: 04/29/2011 Document Revised: 02/28/2014 Document Reviewed: 09/15/2013 [facility] Patient Information 2015 [place], [place]. This information is not intended to replace advice given to you by your health care provider. Make sure you discuss any questions you have with your health care provider.

## 2015-05-25 NOTE — Progress Notes (Signed)
Patient ID: Victoria Gilmore, female    DOB: 1975/11/13  Age: 39 y.o. MRN: [id number]  The patient is here for annualwellness examination and management of other chronic problems.  The risk factors are reflected in the social history.  Normal PAP July 2014   The roster of all physicians providing medical care to patient - is listed in the Snapshot section of the chart.  Activities of daily living:  The patient is 100% independent in all ADLs: dressing, toileting, feeding as well as independent mobility  Home safety : The patient has smoke detectors in the home. They wear seatbelts.  There are no firearms at home. There is no violence in the home.   There is no risks for hepatitis, STDs or HIV. There is no   history of blood transfusion. They have no travel history to infectious disease endemic areas of the world.  The patient has seen their dentist in the last six month. They have seen their eye doctor in the last year. They admit to slight hearing difficulty with regard to whispered voices and some television programs.  They have deferred audiologic testing in the last year.  They do not  have excessive sun exposure. Discussed the need for sun protection: hats, long sleeves and use of sunscreen if there is significant sun exposure.   Diet: the importance of a healthy diet is discussed. They do have a healthy diet.  The benefits of regular aerobic exercise were discussed. She walks 4 times per week ,  20 minutes.   Depression screen: there are no signs or vegative symptoms of depression- irritability, change in appetite, anhedonia, sadness/tearfullness.  Cognitive assessment: the patient manages all their financial and personal affairs and is actively engaged. They could relate day,date,year and events; recalled 2/3 objects at 3 minutes; performed clock-face test normally.  The following portions of the patient's history were reviewed and updated as appropriate: allergies, current  medications, past family history, past medical history,  past surgical history, past social history  and problem list.  Visual acuity was not assessed per patient preference since she has regular follow up with her ophthalmologist. Hearing and body mass index were assessed and reviewed.   During the course of the visit the patient was educated and counseled about appropriate screening and preventive services including : fall prevention , diabetes screening, nutrition counseling, colorectal cancer screening, and recommended immunizations.    CC: The primary encounter diagnosis was Other fatigue. Diagnoses of Hyperlipidemia, Routine general medical examination at a health care facility, Panic disorder, Benign paroxysmal positional vertigo, unspecified laterality, and Overweight (BMI 25.0-29.9) were also pertinent to this visit.   Had a panic attack while drivng to work in January,  Had a very frustrating experience tyring to get an acute appointment.  She was eventually seen in the ER and evaluated and treated for panic attack.  She thinks the attack may have been triggered ny an episode of vertigo.  Drives a very hilly road daily to work .  notices some vertigo with sudden position changes . Still very apprehensive driving more than 15 monitues      History Victoria Gilmore has a past medical history of Hematuria.   She has no past surgical history on file.   Her family history includes Hypertension in her father.She reports that she has never smoked. She has never used smokeless tobacco. She reports that she drinks alcohol. She reports that she does not use illicit drugs.  Outpatient Prescriptions Prior to Visit  Medication Sig Dispense Refill  . cetirizine (ZYRTEC) 10 MG tablet Take 10 mg by mouth as needed for allergies.     Marland Kitchen ibuprofen (ADVIL,MOTRIN) 200 MG tablet Take 400 mg by mouth every 6 (six) hours as needed for moderate pain.    Marland Kitchen omeprazole (PRILOSEC) 40 MG capsule Take 1 capsule (40 mg  total) by mouth daily. 30 capsule 3  . OVER THE COUNTER MEDICATION Apply 1 application topically daily as needed (for ringworm).     . LORazepam (ATIVAN) 1 MG tablet Take 0.5 tablets (0.5 mg total) by mouth every 8 (eight) hours as needed for anxiety. (Patient not taking: Reported on 05/25/2015) 10 tablet 0   No facility-administered medications prior to visit.    Review of Systems   Patient denies headache, fevers, malaise, unintentional weight loss, skin rash, eye pain,  sinus pain, sore throat, dysphagia,  hemoptysis , cough, dyspnea, wheezing, chest pain, palpitations, orthopnea, edema, abdominal pain, nausea, melena, diarrhea, constipation, flank pain, dysuria, hematuria, urinary  Frequency, nocturia, numbness, tingling, seizures,  Focal weakness, Loss of consciousness,  Tremor, insomnia, depression, , and suicidal ideation.      Objective:    BP 108/74 mmHg  Pulse 84  Temp(Src) 97.7 F (36.5 C) (Oral)  Resp 16  Ht 5\' 7"  (1.702 m)  Wt 175 lb 12 oz (79.72 kg)  BMI 27.52 kg/m2  SpO2 100%  LMP 04/27/2015  Physical Exam   General appearance: alert, cooperative and appears stated age Head: Normocephalic, without obvious abnormality, atraumatic Eyes: conjunctivae/corneas clear. PERRL, EOM's intact. Fundi benign. Ears: normal TM's and external ear canals both ears Nose: Nares normal. Septum midline. Mucosa normal. No drainage or sinus tenderness. Throat: lips, mucosa, and tongue normal; teeth and gums normal Neck: no adenopathy, no carotid bruit, no JVD, supple, symmetrical, trachea midline and thyroid not enlarged, symmetric, no tenderness/mass/nodules Lungs: clear to auscultation bilaterally Breasts: normal appearance, no masses or tenderness Heart: regular rate and rhythm, S1, S2 normal, no murmur, click, rub or gallop Abdomen: soft, non-tender; bowel sounds normal; no masses,  no organomegaly Extremities: extremities normal, atraumatic, no cyanosis or edema Pulses: 2+ and  symmetric Skin: Skin color, texture, turgor normal. No rashes or lesions Neurologic: Alert and oriented X 3, normal strength and tone. Normal symmetric reflexes. Normal coordination and gait.     Assessment & Plan:   Problem List Items Addressed This Visit      Unprioritized   Routine general medical examination at a health care facility    Annual wellness  exam was done as well as a comprehensive physical exam and management of acute and chronic conditions .  During the course of the visit the patient was educated and counseled about appropriate screening and preventive services including :  diabetes screening, lipid analysis with projected  10 year  risk for CAD , nutrition counseling, colorectal cancer screening, and recommended immunizations.  Printed recommendations for health maintenance screenings was given.        Overweight (BMI 25.0-29.9)    I have congratulated her in reduction of   BMI and encouraged  Continued weight loss with goal of 10% of body weigh over the next 6 months using a low glycemic index diet and regular exercise a minimum of 5 days per week.   Wt Readings from Last 3 Encounters:  05/25/15 175 lb 12 oz (79.72 kg)  11/05/14 180 lb (81.647 kg)  07/09/13 168 lb (76.204 kg)        Panic disorder  Triggered by an attack of vertigo that occurred in January while driving.  Managed with lorazepam prn.  The risks and benefits of benzodiazepine use were discussed with patient today including excessive sedation leading to respiratory depression,  impaired thinking/driving, and addiction.  Patient was advised to avoid concurrent use with alcohol, to use medication only as needed and not to share with others  .       Relevant Medications   LORazepam (ATIVAN) 0.5 MG tablet   Benign paroxysmal positional vertigo    Trial of fluticasone nasal spray given concurrent symptoms of sinus congestion.        Other Visit Diagnoses    Other fatigue    -  Primary    Relevant  Orders    CBC with Differential (Completed)    TSH (Completed)    Comprehensive metabolic panel (Completed)    Hyperlipidemia        Relevant Orders    Lipid panel (Completed)       I have changed Victoria Gilmore's LORazepam. I am also having her start on fluticasone. Additionally, I am having her maintain her cetirizine, omeprazole, ibuprofen, and OVER THE COUNTER MEDICATION.  Meds ordered this encounter  Medications  . LORazepam (ATIVAN) 0.5 MG tablet    Sig: Take 1 tablet (0.5 mg total) by mouth every 8 (eight) hours as needed for anxiety.    Dispense:  30 tablet    Refill:  2  . fluticasone (FLONASE) 50 MCG/ACT nasal spray    Sig: Place 2 sprays into both nostrils daily.    Dispense:  16 g    Refill:  6    Medications Discontinued During This Encounter  Medication Reason  . LORazepam (ATIVAN) 1 MG tablet Reorder    Follow-up: No Follow-up on file.   Sherlene Shams, MD

## 2015-05-25 NOTE — Progress Notes (Signed)
Pre-visit discussion using our clinic review tool. No additional management support is needed unless otherwise documented below in the visit note.  

## 2015-05-27 ENCOUNTER — Encounter: Payer: Self-pay | Admitting: Internal Medicine

## 2015-05-27 DIAGNOSIS — H8110 Benign paroxysmal vertigo, unspecified ear: Secondary | ICD-10-CM | POA: Insufficient documentation

## 2015-05-27 DIAGNOSIS — F410 Panic disorder [episodic paroxysmal anxiety] without agoraphobia: Secondary | ICD-10-CM | POA: Insufficient documentation

## 2015-05-27 NOTE — Assessment & Plan Note (Signed)
Triggered by an attack of vertigo that occurred in January while driving.  Managed with lorazepam prn.  The risks and benefits of benzodiazepine use were discussed with patient today including excessive sedation leading to respiratory depression,  impaired thinking/driving, and addiction.  Patient was advised to avoid concurrent use with alcohol, to use medication only as needed and not to share with others  .

## 2015-05-27 NOTE — Assessment & Plan Note (Signed)
I have congratulated her in reduction of   BMI and encouraged  Continued weight loss with goal of 10% of body weigh over the next 6 months using a low glycemic index diet and regular exercise a minimum of 5 days per week.   Wt Readings from Last 3 Encounters:  05/25/15 175 lb 12 oz (79.72 kg)  11/05/14 180 lb (81.647 kg)  07/09/13 168 lb (76.204 kg)

## 2015-05-27 NOTE — Assessment & Plan Note (Signed)
Trial of fluticasone nasal spray given concurrent symptoms of sinus congestion.

## 2015-05-27 NOTE — Assessment & Plan Note (Signed)

## 2015-05-29 ENCOUNTER — Encounter: Payer: Self-pay | Admitting: *Deleted

## 2015-06-23 ENCOUNTER — Telehealth: Payer: Self-pay | Admitting: Internal Medicine

## 2015-06-23 NOTE — Telephone Encounter (Signed)
Pt called about a concern with her right breast. Pt would like to be seen next week. Pt can come in at 3:45p. Pt does not want to see any other provider. Let me know. Thank You!

## 2015-06-23 NOTE — Telephone Encounter (Signed)
Spoke with pt, scheduled her 8.29.16 at 6pm per Olegario Messier

## 2015-06-26 ENCOUNTER — Ambulatory Visit (INDEPENDENT_AMBULATORY_CARE_PROVIDER_SITE_OTHER): Payer: BC Managed Care – PPO | Admitting: Internal Medicine

## 2015-06-26 ENCOUNTER — Encounter: Payer: Self-pay | Admitting: Internal Medicine

## 2015-06-26 VITALS — BP 112/74 | HR 79 | Temp 98.1°F | Ht 67.0 in | Wt 179.0 lb

## 2015-06-26 DIAGNOSIS — N6310 Unspecified lump in the right breast, unspecified quadrant: Secondary | ICD-10-CM

## 2015-06-26 DIAGNOSIS — N63 Unspecified lump in breast: Secondary | ICD-10-CM

## 2015-06-26 LAB — POCT URINE PREGNANCY: Preg Test, Ur: NEGATIVE

## 2015-06-26 MED ORDER — ALPRAZOLAM 0.5 MG PO TABS: 0.5000 mg | ORAL_TABLET | Freq: Every evening | ORAL | Status: DC | PRN

## 2015-06-26 NOTE — Patient Instructions (Signed)
I have ordered a "diagnostic mammogram of your right breast to investigate the lump we both felt today  If you have trouble sleeping because of this,  You can use alprazolam to help you relax  If the Radiologist wants to do a biopsy,  Please tell them that you want to discuss it with Dr Darrick Huntsman

## 2015-06-26 NOTE — Progress Notes (Signed)
Pre visit review using our clinic review tool, if applicable. No additional management support is needed unless otherwise documented below in the visit note. 

## 2015-06-27 ENCOUNTER — Other Ambulatory Visit: Payer: Self-pay | Admitting: Internal Medicine

## 2015-06-27 DIAGNOSIS — N6310 Unspecified lump in the right breast, unspecified quadrant: Secondary | ICD-10-CM

## 2015-06-27 NOTE — Assessment & Plan Note (Addendum)
Appreciated on exam today.  Fluctuating size discussed as a sign of a benign process but will diagnostic breast mammogram  To rule out neoplasm.

## 2015-06-27 NOTE — Progress Notes (Signed)
Subjective:  Patient ID: Victoria Gilmore, female    DOB: 1976/04/25  Age: 39 y.o. MRN: [id number]  CC: The encounter diagnosis was Breast mass, right.  HPI Victoria Gilmore presents for evaluation of breast mass.  Patient was performing a self breast exam one week ago and felt a nontender mass in her right breast just lateral to the nipple and the 9:00 position.  She has noted persistent abnormality that has fluctuated in size .    Last mammogram normal August 2014   Family History  Problem Relation Age of Onset  . Hypertension Father     Outpatient Prescriptions Prior to Visit  Medication Sig Dispense Refill  . cetirizine (ZYRTEC) 10 MG tablet Take 10 mg by mouth as needed for allergies.     Marland Kitchen ibuprofen (ADVIL,MOTRIN) 200 MG tablet Take 400 mg by mouth every 6 (six) hours as needed for moderate pain.    Marland Kitchen OVER THE COUNTER MEDICATION Apply 1 application topically daily as needed (for ringworm).     . fluticasone (FLONASE) 50 MCG/ACT nasal spray Place 2 sprays into both nostrils daily. (Patient not taking: Reported on 06/26/2015) 16 g 6  . LORazepam (ATIVAN) 0.5 MG tablet Take 1 tablet (0.5 mg total) by mouth every 8 (eight) hours as needed for anxiety. (Patient not taking: Reported on 06/26/2015) 30 tablet 2  . omeprazole (PRILOSEC) 40 MG capsule Take 1 capsule (40 mg total) by mouth daily. (Patient not taking: Reported on 06/26/2015) 30 capsule 3   No facility-administered medications prior to visit.    Review of Systems;  Patient denies headache, fevers, malaise, unintentional weight loss, skin rash, eye pain, sinus congestion and sinus pain, sore throat, dysphagia,  hemoptysis , cough, dyspnea, wheezing, chest pain, palpitations, orthopnea, edema, abdominal pain, nausea, melena, diarrhea, constipation, flank pain, dysuria, hematuria, urinary  Frequency, nocturia, numbness, tingling, seizures,  Focal weakness, Loss of consciousness,  Tremor, insomnia, depression, anxiety, and  suicidal ideation.      Objective:  BP 112/74 mmHg  Pulse 79  Temp(Src) 98.1 F (36.7 C) (Oral)  Ht 5\' 7"  (1.702 m)  Wt 179 lb (81.194 kg)  BMI 28.03 kg/m2  SpO2 99%  LMP 06/24/2015  BP Readings from Last 3 Encounters:  06/26/15 112/74  05/25/15 108/74  11/05/14 117/77    Wt Readings from Last 3 Encounters:  06/26/15 179 lb (81.194 kg)  05/25/15 175 lb 12 oz (79.72 kg)  11/05/14 180 lb (81.647 kg)    General Appearance:    Alert, cooperative, no distress, appears stated age  Breast Exam:    Right breast with 0.5 cm mass at 9:00 position just lateral to nipple , nontender             .  Lab Results  Component Value Date   HGBA1C 5.4 05/06/2013    Lab Results  Component Value Date   CREATININE 0.63 05/25/2015   CREATININE 0.67 11/05/2014   CREATININE 0.7 05/06/2013    Lab Results  Component Value Date   WBC 5.4 05/25/2015   HGB 12.8 05/25/2015   HCT 38.8 05/25/2015   PLT 230.0 05/25/2015   GLUCOSE 79 05/25/2015   CHOL 139 05/25/2015   TRIG 34.0 05/25/2015   HDL 51.50 05/25/2015   LDLDIRECT 89.4 04/10/2011   LDLCALC 80 05/25/2015   ALT 11 05/25/2015   AST 17 05/25/2015   NA 137 05/25/2015   K 3.8 05/25/2015   CL 107 05/25/2015   CREATININE 0.63 05/25/2015   BUN  10 05/25/2015   CO2 25 05/25/2015   TSH 1.50 05/25/2015   HGBA1C 5.4 05/06/2013    Dg Chest 2 View  11/05/2014   CLINICAL DATA:  Central region chest pain for 2 days  EXAM: CHEST  2 VIEW  COMPARISON:  April 22, 2009  FINDINGS: Lungs are clear. Heart size and pulmonary vascularity are normal. No adenopathy. No pneumothorax. There is slight upper thoracic levoscoliosis.  IMPRESSION: No edema or consolidation.   Electronically Signed   By: Bretta Bang M.D.   On: 11/05/2014 16:27    Assessment & Plan:   Problem List Items Addressed This Visit      Unprioritized   Breast mass, right - Primary    Appreciated on exam today.  Fluctuating size discussed as a sign of a benign process but  will diagnostic breast mammogram  To rule out neoplasm.        Relevant Orders   POCT urine pregnancy (Completed)   MM DIAG BREAST TOMO BILATERAL     A total of 25 minutes of face to face time was spent with patient more than half of which was spent in counselling about the above mentioned conditions  and coordination of care  I have discontinued Ms. Twiford's omeprazole, LORazepam, and fluticasone. I am also having her start on ALPRAZolam. Additionally, I am having her maintain her cetirizine, ibuprofen, and OVER THE COUNTER MEDICATION.  Meds ordered this encounter  Medications  . ALPRAZolam (XANAX) 0.5 MG tablet    Sig: Take 1 tablet (0.5 mg total) by mouth at bedtime as needed for anxiety.    Dispense:  30 tablet    Refill:  3    Medications Discontinued During This Encounter  Medication Reason  . LORazepam (ATIVAN) 0.5 MG tablet   . omeprazole (PRILOSEC) 40 MG capsule   . fluticasone (FLONASE) 50 MCG/ACT nasal spray     Follow-up: No Follow-up on file.   Sherlene Shams, MD

## 2015-06-29 ENCOUNTER — Telehealth: Payer: Self-pay | Admitting: Internal Medicine

## 2015-06-29 NOTE — Telephone Encounter (Signed)
Please call patient back re message.  She was seen on Monday and diagnostic mammogram was ordered of right breast.

## 2015-06-29 NOTE — Telephone Encounter (Signed)
Pt states she needs a call back regarding some changes she has been having since yesterday. Thank You!

## 2015-06-30 ENCOUNTER — Ambulatory Visit
Admission: RE | Admit: 2015-06-30 | Discharge: 2015-06-30 | Disposition: A | Discharge status: Home / Self Care | Payer: BC Managed Care – PPO | Source: Ambulatory Visit | Attending: Internal Medicine | Admitting: Internal Medicine

## 2015-06-30 DIAGNOSIS — N6310 Unspecified lump in the right breast, unspecified quadrant: Secondary | ICD-10-CM

## 2015-06-30 NOTE — Telephone Encounter (Signed)
i would prefer that she still pan to get the mammogram today

## 2015-06-30 NOTE — Telephone Encounter (Signed)
Clarity: She is going to get the mammogram today!

## 2015-06-30 NOTE — Telephone Encounter (Signed)
Spoke with a patient, clarity regarding her first message.  Patient had a burning sensations in her right breast  In the same area as you had spoke about at the visit on Monday and she was wondering if it had been a cyst that may have bursted.  Wanted to make you aware.  She is going to have the mammogram done today at 230.  Please advise?

## 2015-06-30 NOTE — Telephone Encounter (Signed)
disregard

## 2015-06-30 NOTE — Telephone Encounter (Signed)
Spoke with patient, will call back when her class dismisses in about 30 minutes to discuss phone call.

## 2015-08-11 ENCOUNTER — Ambulatory Visit (INDEPENDENT_AMBULATORY_CARE_PROVIDER_SITE_OTHER): Payer: BC Managed Care – PPO | Admitting: Family Medicine

## 2015-08-11 ENCOUNTER — Encounter: Payer: Self-pay | Admitting: Family Medicine

## 2015-08-11 VITALS — BP 120/72 | HR 93 | Temp 98.5°F | Ht 67.0 in | Wt 181.5 lb

## 2015-08-11 DIAGNOSIS — H02849 Edema of unspecified eye, unspecified eyelid: Secondary | ICD-10-CM | POA: Insufficient documentation

## 2015-08-11 DIAGNOSIS — H02843 Edema of right eye, unspecified eyelid: Secondary | ICD-10-CM

## 2015-08-11 MED ORDER — OLOPATADINE HCL 0.1 % OP SOLN
1.0000 [drp] | Freq: Two times a day (BID) | OPHTHALMIC | Status: DC
Start: 2015-08-11 — End: 2016-05-27

## 2015-08-11 NOTE — Progress Notes (Signed)
   Subjective:  Patient ID: Victoria Gilmore, female    DOB: Nov 06, 1975  Age: 39 y.o. MRN: [id number]  CC: R eyelid swelling  HPI:  39 year old female presents to clinic today for an acute visit with the above complaint.  Patient reports that she developed swelling of the right upper eyelid on Monday. She did not feel a discrete bone. She reports that this has persisted and worsened this morning. She states that her eye was essentially swollen shut this morning. No associated fevers or chills. No vision changes. No known new exposures or changes. No known sick contacts.  Social Hx   Social History   Social History  . Marital Status: Married    Spouse Name: N/A  . Number of Children: N/A  . Years of Education: N/A   Social History Main Topics  . Smoking status: Never Smoker   . Smokeless tobacco: Never Used  . Alcohol Use: Yes     Comment: Very rare  . Drug Use: No  . Sexual Activity: Yes   Other Topics Concern  . 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   Review of Systems  Constitutional: Negative for fever and chills.  Eyes: Positive for itching. Negative for visual disturbance.   Objective:  BP 120/72 mmHg  Pulse 93  Temp(Src) 98.5 F (36.9 C) (Oral)  Ht 5\' 7"  (1.702 m)  Wt 181 lb 8 oz (82.328 kg)  BMI 28.42 kg/m2  SpO2 98%  LMP 07/28/2015  BP/Weight 08/11/2015 06/26/2015 05/25/2015  Systolic BP 120 112 108  Diastolic BP 72 74 74  Wt. (Lbs) 181.5 179 175.75  BMI 28.42 28.03 27.52    Physical Exam  Constitutional: She is oriented to person, place, and time. She appears well-developed and well-nourished. No distress.  HENT:  Head: Normocephalic and atraumatic.  Eyes: Conjunctivae and EOM are normal. Right eye exhibits no discharge.  R eyelid (upper) with mild swelling, laterally. No periorbital redness.   Neurological: She is alert and oriented to person, place, and time.  Psychiatric: She has  a normal mood and affect.  Vitals reviewed.  Lab Results  Component Value Date   WBC 5.4 05/25/2015   HGB 12.8 05/25/2015   HCT 38.8 05/25/2015   PLT 230.0 05/25/2015   GLUCOSE 79 05/25/2015   CHOL 139 05/25/2015   TRIG 34.0 05/25/2015   HDL 51.50 05/25/2015   LDLDIRECT 89.4 04/10/2011   LDLCALC 80 05/25/2015   ALT 11 05/25/2015   AST 17 05/25/2015   NA 137 05/25/2015   K 3.8 05/25/2015   CL 107 05/25/2015   CREATININE 0.63 05/25/2015   BUN 10 05/25/2015   CO2 25 05/25/2015   TSH 1.50 05/25/2015   HGBA1C 5.4 05/06/2013    Assessment & Plan:   Problem List Items Addressed This Visit    Swollen eyelid - Primary    Chalazion versus allergy. Advised warm compresses. Rx given for Patanol. Return precautions given.        Meds ordered this encounter  Medications  . olopatadine (PATANOL) 0.1 % ophthalmic solution    Sig: Place 1 drop into the right eye 2 (two) times daily.    Dispense:  5 mL    Refill:  1    Follow-up: PRN  Everlene OtherJayce Gracey Tolle, DO

## 2015-08-11 NOTE — Progress Notes (Signed)
Pre visit review using our clinic review tool, if applicable. No additional management support is needed unless otherwise documented below in the visit note. 

## 2015-08-11 NOTE — Assessment & Plan Note (Signed)
Chalazion versus allergy. Advised warm compresses. Rx given for Patanol. Return precautions given.

## 2015-08-11 NOTE — Patient Instructions (Signed)
Use warm compresses several times daily.  Use the eye drop as prescribed.  If it persists or worsens please let me know. If you develop severe pain, vision loss, or redness around the eye please call immediately.  Take care  Dr. Adriana Simasook

## 2015-08-28 ENCOUNTER — Telehealth: Payer: Self-pay | Admitting: Internal Medicine

## 2015-08-28 NOTE — Telephone Encounter (Signed)
Had Mammogram completed in sept.  Please advise?

## 2015-08-28 NOTE — Telephone Encounter (Signed)
Mammogram and targeted ultrasound failed to show anything abnormal,  So It is probably a cyst,  Or normal breast tissue. I am happy to see her again for evaluation , or send her to a surgeon for a 2nd opinioni

## 2015-08-28 NOTE — Telephone Encounter (Signed)
Pt called about a question regarding her mammo. Pt says she still feels the Lump. Pt wants to know what it is? Thank You!

## 2015-08-28 NOTE — Telephone Encounter (Signed)
Spoke with patient, she will keep a watch on it for the next few months.  She will follow up if it gets bothersome.

## 2015-08-28 NOTE — Telephone Encounter (Signed)
Attempted to call patient, unable to leave a message mailbox is full.

## 2015-10-16 ENCOUNTER — Ambulatory Visit: Payer: BC Managed Care – PPO | Admitting: Internal Medicine

## 2016-02-12 ENCOUNTER — Telehealth: Payer: Self-pay | Admitting: Internal Medicine

## 2016-02-12 NOTE — Telephone Encounter (Signed)
Pt would like for you to call he back

## 2016-02-12 NOTE — Telephone Encounter (Signed)
Scheduled for 1130 on Wednesday this week, 4/19

## 2016-02-12 NOTE — Telephone Encounter (Signed)
yes

## 2016-02-12 NOTE — Telephone Encounter (Signed)
Attempted to call patient back unable to leave a VM.

## 2016-02-12 NOTE — Telephone Encounter (Signed)
Please advise, can this be a 1130 visit?

## 2016-02-12 NOTE — Telephone Encounter (Signed)
Patient does not want to see anyone else. Needing to see Dr. Darrick Huntsmanullo for pain in rectum.

## 2016-02-13 NOTE — Telephone Encounter (Signed)
Pt has returned office call. She can not make 11:30 . She has to take her mother-in-law to doctors appt. She can make 1 pm or later or anytime before 11am ton Wednesday. Her phone is [phone].

## 2016-02-13 NOTE — Telephone Encounter (Signed)
Pleae change patient appointment from 11.30 to 1 but do not book any one at 11.30 patient aware.

## 2016-02-13 NOTE — Telephone Encounter (Signed)
Yes, ok to give a 1:00 on Wednsesday.  DO NOT BOOK AN 11:30 ON Wednesday.

## 2016-02-13 NOTE — Telephone Encounter (Signed)
Please advise would you want to switch to 1 ?

## 2016-02-13 NOTE — Telephone Encounter (Signed)
Olegario MessierKathy please advise, Dr. Darrick Huntsmantullo had allowed a 1130 for this patient, will she allow a 1pm?

## 2016-02-14 ENCOUNTER — Ambulatory Visit (INDEPENDENT_AMBULATORY_CARE_PROVIDER_SITE_OTHER): Payer: BC Managed Care – PPO | Admitting: Internal Medicine

## 2016-02-14 VITALS — BP 126/78 | HR 84 | Temp 97.6°F | Resp 12 | Ht 67.0 in | Wt 179.8 lb

## 2016-02-14 DIAGNOSIS — M898X1 Other specified disorders of bone, shoulder: Secondary | ICD-10-CM | POA: Diagnosis not present

## 2016-02-14 DIAGNOSIS — K5900 Constipation, unspecified: Secondary | ICD-10-CM

## 2016-02-14 DIAGNOSIS — N816 Rectocele: Secondary | ICD-10-CM | POA: Diagnosis not present

## 2016-02-14 DIAGNOSIS — R5383 Other fatigue: Secondary | ICD-10-CM | POA: Diagnosis not present

## 2016-02-14 LAB — CBC WITH DIFFERENTIAL/PLATELET
BASOS ABS: 0 10*3/uL (ref 0.0–0.1)
Basophils Relative: 0.4 % (ref 0.0–3.0)
Eosinophils Absolute: 0.2 10*3/uL (ref 0.0–0.7)
Eosinophils Relative: 2.7 % (ref 0.0–5.0)
HEMATOCRIT: 36.3 % (ref 36.0–46.0)
Hemoglobin: 12.1 g/dL (ref 12.0–15.0)
LYMPHS PCT: 33.4 % (ref 12.0–46.0)
Lymphs Abs: 1.9 10*3/uL (ref 0.7–4.0)
MCHC: 33.4 g/dL (ref 30.0–36.0)
MCV: 92.2 fl (ref 78.0–100.0)
Monocytes Absolute: 0.5 10*3/uL (ref 0.1–1.0)
Monocytes Relative: 8.3 % (ref 3.0–12.0)
Neutro Abs: 3.1 10*3/uL (ref 1.4–7.7)
Neutrophils Relative %: 55.2 % (ref 43.0–77.0)
PLATELETS: 243 10*3/uL (ref 150.0–400.0)
RBC: 3.94 Mil/uL (ref 3.87–5.11)
RDW: 12.6 % (ref 11.5–15.5)
WBC: 5.7 10*3/uL (ref 4.0–10.5)

## 2016-02-14 LAB — COMPREHENSIVE METABOLIC PANEL
ALBUMIN: 3.9 g/dL (ref 3.5–5.2)
ALK PHOS: 51 U/L (ref 39–117)
ALT: 11 U/L (ref 0–35)
AST: 16 U/L (ref 0–37)
BUN: 14 mg/dL (ref 6–23)
CALCIUM: 9.3 mg/dL (ref 8.4–10.5)
CHLORIDE: 106 meq/L (ref 96–112)
CO2: 25 mEq/L (ref 19–32)
Creatinine, Ser: 0.67 mg/dL (ref 0.40–1.20)
GFR: 125.6 mL/min (ref >60.00)
Glucose, Bld: 98 mg/dL (ref 70–99)
POTASSIUM: 3.7 meq/L (ref 3.5–5.1)
SODIUM: 137 meq/L (ref 135–145)
Total Bilirubin: 0.3 mg/dL (ref 0.2–1.2)
Total Protein: 7.2 g/dL (ref 6.0–8.3)

## 2016-02-14 LAB — VITAMIN B12: VITAMIN B 12: 463 pg/mL (ref 211–911)

## 2016-02-14 LAB — TSH: TSH: 0.69 u[IU]/mL (ref 0.35–4.50)

## 2016-02-14 NOTE — Patient Instructions (Addendum)
You should start using colace (docusate), a stool softener 100 mg up to 2 daily to prevent constipation  You can also add miralax, citrucel, or metamucil at bedtime if needed (to avoid every having to use Citrate of magnesium every again )    About Rectocele   Overview  A rectocele is a type of hernia which causes different degrees of bulging of the rectal tissues into the vaginal wall.  You may even notice that it presses against the vaginal wall so much that some vaginal tissues droop outside of the opening of your vagina.  Causes of Rectocele  The most common cause is childbirth.  The muscles and ligaments in the pelvis that hold up and support the female organs and vagina become stretched and weakened during labor and delivery.  The more babies you have, the more the support tissues are stretched and weakened.  Not everyone who has a baby will develop a rectocele.  Some women have stronger supporting tissue in the pelvis and may not have as much of a problem as others.  Women who have a Cesarean section usually do not get rectocele's unless they pushed a long time prior to the cesarean delivery.  Other conditions that can cause a rectocele include chronic constipation, a chronic cough, a lot of heavy lifting, and obesity.  Older women may have this problem because the loss of female hormones causes the vaginal tissue to become weaker.  Symptoms  There may not be any symptoms.  If you do have symptoms, they may include:  Pelvic pressure in the rectal area  Protrusion of the lower part of the vagina through the opening of the vagina  Constipation and trapping of the stool, making it difficult to have a bowel movement.  In severe cases, you may have to press on the lower part of your vagina to help push the stool out of you rectum.  This is called splinting to empty.  Diagnosing Rectocele  Your health care provider will ask about your symptoms and perform a pelvic exam.  S/he will ask  you to bear down, pushing like you are having a bowel movement so as to see how far the lower part of the vagina protrudes into the vagina and possible outside of the vagina.  Your provider will also ask you to contract the muscles of your pelvis (like you are stopping the stream in the middle of urinating) to determine the strength of your pelvic muscles.  Your provider may also do a rectal exam.  Treatment Options  If you do not have any symptoms, no treatment may be necessary.  Other treatment options include:  Pelvic floor exercises: Contracting the muscles in your genital area may help strengthen your muscles and support the organs.  Be sure to get proper exercise instruction from you physical therapist.  A pessary (removealbe pelvic support device) sometimes helps rectocele symptoms.  Surgery: Surgical repair may be necessary. In some cases the uterus may need to be taken out ( a hysterectomy) as well.  There are many types of surgery for pelvic support problems.  Look for physicians who specialize in repair procedures.  You can take care of yourself by:  Treating and preventing constipation  Avoiding heavy lifting, and lifting correctly (with your legs, not with you waist or back)  Treating a chronic cough or bronchitis  Not smoking  avoiding too much weight gain  Doing pelvic floor exercises   2007, [employer] Doc.33

## 2016-02-14 NOTE — Progress Notes (Signed)
Subjective:  Patient ID: Victoria Gilmore, female    DOB: [birth year]  Age: 40 y.o. MRN: [id number]  CC: The primary encounter diagnosis was Rectocele, female. Diagnoses of Other fatigue, Constipation, unspecified constipation type, and Pain of left scapula were also pertinent to this visit.  HPI Victoria Gilmore presents for evaluation of rectal pain .  The pain started  In her lower back 3 weeks ago and became progressively worse, and was accompanied by increasing difficulty moving bowels due to constipation .  Tried to add more fruit to diet,  Did not take a fiber supplement or stool softener .  Drank a bottle of citrate of magnesium  Which relieved the constipation and lower abdominal pressure  But started having pain in the perineal area,  Which she describes as having a stinging quality and a feeling of pressure.  . Had pain after defecating but no bleeding. She discussed her symptoms with her sister and thinks she may have a Talked to sister who has a rectocele.    2) She has been having pain in the left scapula/shoulder pain brought on by lying on her left side and the pain radiates to her leftg lateral chest wall.  Not brought on with exertion,  Not postprandial.    Outpatient Prescriptions Prior to Visit  Medication Sig Dispense Refill  . ibuprofen (ADVIL,MOTRIN) 200 MG tablet Take 400 mg by mouth every 6 (six) hours as needed for moderate pain.    Marland Kitchen ALPRAZolam (XANAX) 0.5 MG tablet Take 1 tablet (0.5 mg total) by mouth at bedtime as needed for anxiety. (Patient not taking: Reported on 08/11/2015) 30 tablet 3  . cetirizine (ZYRTEC) 10 MG tablet Take 10 mg by mouth as needed for allergies. Reported on 02/14/2016    . olopatadine (PATANOL) 0.1 % ophthalmic solution Place 1 drop into the right eye 2 (two) times daily. (Patient not taking: Reported on 02/14/2016) 5 mL 1  . OVER THE COUNTER MEDICATION Apply 1 application topically daily as needed (for ringworm). Reported on 02/14/2016      No facility-administered medications prior to visit.    Review of Systems;  Patient denies headache, fevers, malaise, unintentional weight loss, skin rash, eye pain, sinus congestion and sinus pain, sore throat, dysphagia,  hemoptysis , cough, dyspnea, wheezing,  palpitations, orthopnea, edema, abdominal pain, nausea, melena, diarrhea, , flank pain, dysuria, hematuria, urinary  Frequency, nocturia, numbness, tingling, seizures,  Focal weakness, Loss of consciousness,  Tremor, insomnia, depression, anxiety, and suicidal ideation.      Objective:  BP 126/78 mmHg  Pulse 84  Temp(Src) 97.6 F (36.4 C) (Oral)  Resp 12  Ht  (1.702 m)  Wt 179 lb 12 oz (81.534 kg)  BMI 28.15 kg/m2  SpO2 95%  BP Readings from Last 3 Encounters:  02/14/16 126/78  08/11/15 120/72  06/26/15 112/74    Wt Readings from Last 3 Encounters:  02/14/16 179 lb 12 oz (81.534 kg)  08/11/15 181 lb 8 oz (82.328 kg)  06/26/15 179 lb (81.194 kg)      General Appearance:    Alert, cooperative, no distress, appears stated age  Head:    Normocephalic, without obvious abnormality, atraumatic     Neck:   Supple, symmetrical, trachea midline, no adenopathy;    thyroid:  no enlargement/tenderness/nodules; no carotid   bruit or JVD  Back:     Symmetric, no curvature, ROM normal, no CVA tenderness  Lungs:     Clear to auscultation bilaterally, respirations unlabored  Chest Wall:    No tenderness or deformity   Heart:    Regular rate and rhythm, S1 and S2 normal, no murmur, rub   or gallop     Abdomen:     Soft, non-tender, bowel sounds active all four quadrants,    no masses, no organomegaly  GYN/Rectal:    Pelvic: cervix and uterus appear normal,   No discharge or erythema. external genitalia normal, no adnexal masses or tenderness,   Early rectocele with valsalva maneuver. Rectal exam normal, nonpainful.        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  No surgical intervention needed at this point.        Constipation    Advised to use stool softener daily and bulk forming laxative daily,  Avoid cathartics unless absolutely necessary      Pain of left scapula    Muscle spasm suggested by exam .  NSAIDS advised., trial of heating pad/ice.        Other Visit Diagnoses    Other fatigue        Relevant Orders    Comprehensive metabolic panel (Completed)    Vitamin B12 (Completed)    TSH (Completed)    CBC with Differential/Platelet (Completed)      A total of 25 minutes of face to face time was spent with patient more than half of which was spent in counselling about the above mentioned conditions  and coordination of care  I am having [name] maintain her cetirizine, ibuprofen, OVER THE COUNTER MEDICATION, ALPRAZolam, and olopatadine.  No orders of the defined types were placed in this encounter.    There are no discontinued medications.  Follow-up: No Follow-up on file.   Sherlene ShamsULLO, Beena Catano L, MD

## 2016-02-14 NOTE — Progress Notes (Signed)
Pre-visit discussion using our clinic review tool. No additional management support is needed unless otherwise documented below in the visit note.  

## 2016-02-17 DIAGNOSIS — M898X1 Other specified disorders of bone, shoulder: Secondary | ICD-10-CM | POA: Insufficient documentation

## 2016-02-17 DIAGNOSIS — K5900 Constipation, unspecified: Secondary | ICD-10-CM | POA: Insufficient documentation

## 2016-02-17 NOTE — Assessment & Plan Note (Signed)
Muscle spasm suggested by exam .  NSAIDS advised., trial of heating pad/ice.

## 2016-02-17 NOTE — Assessment & Plan Note (Signed)
Reassurance given.  No surgical intervention needed at this point.

## 2016-02-17 NOTE — Assessment & Plan Note (Signed)
Advised to use stool softener daily and bulk forming laxative daily,  Avoid cathartics unless absolutely necessary

## 2016-02-19 ENCOUNTER — Encounter: Payer: Self-pay | Admitting: *Deleted

## 2016-04-16 ENCOUNTER — Other Ambulatory Visit: Payer: Self-pay | Admitting: Internal Medicine

## 2016-05-27 ENCOUNTER — Encounter: Payer: Self-pay | Admitting: Internal Medicine

## 2016-05-27 ENCOUNTER — Ambulatory Visit (INDEPENDENT_AMBULATORY_CARE_PROVIDER_SITE_OTHER): Payer: BC Managed Care – PPO | Admitting: Internal Medicine

## 2016-05-27 ENCOUNTER — Other Ambulatory Visit (HOSPITAL_COMMUNITY)
Admission: RE | Admit: 2016-05-27 | Discharge: 2016-05-27 | Disposition: A | Discharge status: Home / Self Care | Payer: BC Managed Care – PPO | Source: Ambulatory Visit | Attending: Internal Medicine | Admitting: Internal Medicine

## 2016-05-27 VITALS — BP 102/60 | HR 76 | Temp 98.1°F | Ht 66.5 in | Wt 186.4 lb

## 2016-05-27 DIAGNOSIS — E785 Hyperlipidemia, unspecified: Secondary | ICD-10-CM | POA: Diagnosis not present

## 2016-05-27 DIAGNOSIS — R635 Abnormal weight gain: Secondary | ICD-10-CM | POA: Diagnosis not present

## 2016-05-27 DIAGNOSIS — Z0000 Encounter for general adult medical examination without abnormal findings: Secondary | ICD-10-CM

## 2016-05-27 DIAGNOSIS — E559 Vitamin D deficiency, unspecified: Secondary | ICD-10-CM

## 2016-05-27 DIAGNOSIS — Z124 Encounter for screening for malignant neoplasm of cervix: Secondary | ICD-10-CM | POA: Diagnosis not present

## 2016-05-27 DIAGNOSIS — G8929 Other chronic pain: Secondary | ICD-10-CM

## 2016-05-27 DIAGNOSIS — N63 Unspecified lump in breast: Secondary | ICD-10-CM

## 2016-05-27 DIAGNOSIS — E663 Overweight: Secondary | ICD-10-CM

## 2016-05-27 DIAGNOSIS — Z23 Encounter for immunization: Secondary | ICD-10-CM | POA: Diagnosis not present

## 2016-05-27 DIAGNOSIS — N6310 Unspecified lump in the right breast, unspecified quadrant: Secondary | ICD-10-CM

## 2016-05-27 DIAGNOSIS — Z1151 Encounter for screening for human papillomavirus (HPV): Secondary | ICD-10-CM | POA: Insufficient documentation

## 2016-05-27 DIAGNOSIS — M25561 Pain in right knee: Secondary | ICD-10-CM | POA: Diagnosis not present

## 2016-05-27 DIAGNOSIS — Z01419 Encounter for gynecological examination (general) (routine) without abnormal findings: Secondary | ICD-10-CM | POA: Diagnosis present

## 2016-05-27 DIAGNOSIS — M25461 Effusion, right knee: Secondary | ICD-10-CM | POA: Diagnosis not present

## 2016-05-27 DIAGNOSIS — R5383 Other fatigue: Secondary | ICD-10-CM | POA: Diagnosis not present

## 2016-05-27 DIAGNOSIS — Z8639 Personal history of other endocrine, nutritional and metabolic disease: Secondary | ICD-10-CM

## 2016-05-27 DIAGNOSIS — Z1239 Encounter for other screening for malignant neoplasm of breast: Secondary | ICD-10-CM

## 2016-05-27 LAB — CBC WITH DIFFERENTIAL/PLATELET
BASOS ABS: 0 10*3/uL (ref 0.0–0.1)
BASOS PCT: 0.3 % (ref 0.0–3.0)
EOS ABS: 0.1 10*3/uL (ref 0.0–0.7)
Eosinophils Relative: 2.5 % (ref 0.0–5.0)
HCT: 35.1 % — ABNORMAL LOW (ref 36.0–46.0)
Hemoglobin: 11.7 g/dL — ABNORMAL LOW (ref 12.0–15.0)
LYMPHS ABS: 1.8 10*3/uL (ref 0.7–4.0)
Lymphocytes Relative: 35.6 % (ref 12.0–46.0)
MCHC: 33.2 g/dL (ref 30.0–36.0)
MCV: 92.7 fl (ref 78.0–100.0)
MONO ABS: 0.4 10*3/uL (ref 0.1–1.0)
Monocytes Relative: 7.2 % (ref 3.0–12.0)
NEUTROS ABS: 2.7 10*3/uL (ref 1.4–7.7)
NEUTROS PCT: 54.4 % (ref 43.0–77.0)
PLATELETS: 259 10*3/uL (ref 150.0–400.0)
RBC: 3.79 Mil/uL — ABNORMAL LOW (ref 3.87–5.11)
RDW: 12.9 % (ref 11.5–15.5)
WBC: 5 10*3/uL (ref 4.0–10.5)

## 2016-05-27 LAB — VITAMIN D 25 HYDROXY (VIT D DEFICIENCY, FRACTURES): VITD: 30.82 ng/mL (ref 30.00–100.00)

## 2016-05-27 LAB — HEPATIC FUNCTION PANEL
ALBUMIN: 3.9 g/dL (ref 3.5–5.2)
ALT: 14 U/L (ref 0–35)
AST: 17 U/L (ref 0–37)
Alkaline Phosphatase: 49 U/L (ref 39–117)
Bilirubin, Direct: 0.1 mg/dL (ref 0.0–0.3)
TOTAL PROTEIN: 7.4 g/dL (ref 6.0–8.3)
Total Bilirubin: 0.4 mg/dL (ref 0.2–1.2)

## 2016-05-27 LAB — LIPID PANEL
CHOL/HDL RATIO: 2
CHOLESTEROL: 141 mg/dL (ref 0–200)
HDL: 58.7 mg/dL (ref >39.00)
LDL CALC: 74 mg/dL (ref 0–99)
NonHDL: 82.3
TRIGLYCERIDES: 41 mg/dL (ref 0.0–149.0)
VLDL: 8.2 mg/dL (ref 0.0–40.0)

## 2016-05-27 LAB — HEMOGLOBIN A1C: HEMOGLOBIN A1C: 5.1 % (ref 4.6–6.5)

## 2016-05-27 LAB — TSH: TSH: 0.93 u[IU]/mL (ref 0.35–4.50)

## 2016-05-27 LAB — SEDIMENTATION RATE: Sed Rate: 21 mm/hr — ABNORMAL HIGH (ref 0–20)

## 2016-05-27 NOTE — Assessment & Plan Note (Signed)
Checking A1c today  .lasta1c

## 2016-05-27 NOTE — Assessment & Plan Note (Signed)
Negative diagnostic and ultraound.  Screening bilateral mammogram due in September

## 2016-05-27 NOTE — Assessment & Plan Note (Signed)
Pain is caused by kneeling on it, and a fullness is experienced behind the knee.  Symptoms started after walking a mile recently. .  [place] ordered to rule out Baker's cyst.

## 2016-05-27 NOTE — Progress Notes (Signed)
Patient ID: Victoria Gilmore, female    DOB: 1976-07-19  Age: 40 y.o. MRN: [id number]  The patient is here for annual preventive  examination and management of other chronic and acute problems.    Due for PAP smear No prior mammogram Due for teetanuus shot   The risk factors are reflected in the social history.  The roster of all physicians providing medical care to patient - is listed in the Snapshot section of the chart.  Home safety : The patient has smoke detectors in the home. They wear seatbelts.  There are no firearms at home. There is no violence in the home.   There is no risks for hepatitis, STDs or HIV. There is no   history of blood transfusion. They have no travel history to infectious disease endemic areas of the world.  The patient has seen their dentist in the last six month. They have seen their eye doctor in the last year.   They do not  have excessive sun exposure. Discussed the need for sun protection: hats, long sleeves and use of sunscreen if there is significant sun exposure.   Diet: the importance of a healthy diet is discussed. They do have a healthy diet.  The benefits of regular aerobic exercise were discussed. She walks 4 times per week ,  20 minutes.   Depression screen: there are no signs or vegative symptoms of depression- irritability, change in appetite, anhedonia, sadness/tearfullness.  The following portions of the patient's history were reviewed and updated as appropriate: allergies, current medications, past family history, past medical history,  past surgical history, past social history  and problem list.  Visual acuity was not assessed per patient preference since she has regular follow up with her ophthalmologist. Hearing and body mass index were assessed and reviewed.   During the course of the visit the patient was educated and counseled about appropriate screening and preventive services including : fall prevention , diabetes screening,  nutrition counseling, colorectal cancer screening, and recommended immunizations.    CC: The primary encounter diagnosis was Knee pain, chronic, right. Diagnoses of Other fatigue, Vitamin D deficiency, Hyperlipidemia, Weight gain, Pain and swelling of right knee, Cervical cancer screening, Overweight (BMI 25.0-29.9), History of reactive hypoglycemia, Breast mass, right, Breast cancer screening, Encounter for preventive health examination, Need for vaccination with 13-polyvalent pneumococcal conjugate vaccine, and Need for diphtheria-tetanus-pertussis (Tdap) vaccine, adult/adolescent were also pertinent to this visit.  1) Knee pain ,  Bilateral,  R>L after walking for a mile with mother several weeks ago.  Mostly stiffness in the AM  But feels that right knee is painful to knee on and feels swelling in the back of the knee.  2) Frustrated about inability to lose the weight she has gained since 2013.   Wants to lose 20 lbs  Diet reviewed.    History Annemarie has a past medical history of Hematuria.   She has no past surgical history on file.   Her family history includes Hypertension in her father.She reports that she has never smoked. She has never used smokeless tobacco. She reports that she drinks alcohol. She reports that she does not use drugs.  Outpatient Medications Prior to Visit  Medication Sig Dispense Refill  . ALPRAZolam (XANAX) 0.5 MG tablet TAKE ONE TABLET BY MOUTH AT BEDTIME AS NEEDED FOR ANXIETY 30 tablet 2  . cetirizine (ZYRTEC) 10 MG tablet Take 10 mg by mouth as needed for allergies. Reported on 02/14/2016    . ibuprofen (  ADVIL,MOTRIN) 200 MG tablet Take 400 mg by mouth every 6 (six) hours as needed for moderate pain.    Marland Kitchen OVER THE COUNTER MEDICATION Apply 1 application topically daily as needed (for ringworm). Reported on 02/14/2016    . olopatadine (PATANOL) 0.1 % ophthalmic solution Place 1 drop into the right eye 2 (two) times daily. 5 mL 1   No facility-administered  medications prior to visit.     Review of Systems   Patient denies headache, fevers, malaise, unintentional weight loss, skin rash, eye pain, sinus congestion and sinus pain, sore throat, dysphagia,  hemoptysis , cough, dyspnea, wheezing, chest pain, palpitations, orthopnea, edema, abdominal pain, nausea, melena, diarrhea, constipation, flank pain, dysuria, hematuria, urinary  Frequency, nocturia, numbness, tingling, seizures,  Focal weakness, Loss of consciousness,  Tremor, insomnia, depression, anxiety, and suicidal ideation.      Objective:  BP 102/60   Pulse 76   Temp 98.1 F (36.7 C) (Oral)   Ht 5' 6.5" (1.689 m)   Wt 186 lb 6 oz (84.5 kg)   LMP 05/22/2016   SpO2 99%   BMI 29.63 kg/m   Physical Exam   General Appearance:    Alert, cooperative, no distress, appears stated age  Head:    Normocephalic, without obvious abnormality, atraumatic  Eyes:    PERRL, conjunctiva/corneas clear, EOM's intact, fundi    benign, both eyes  Ears:    Normal TM's and external ear canals, both ears  Nose:   Nares normal, septum midline, mucosa normal, no drainage    or sinus tenderness  Throat:   Lips, mucosa, and tongue normal; teeth and gums normal  Neck:   Supple, symmetrical, trachea midline, no adenopathy;    thyroid:  no enlargement/tenderness/nodules; no carotid   bruit or JVD  Back:     Symmetric, no curvature, ROM normal, no CVA tenderness  Lungs:     Clear to auscultation bilaterally, respirations unlabored  Chest Wall:    No tenderness or deformity   Heart:    Regular rate and rhythm, S1 and S2 normal, no murmur, rub   or gallop  Breast Exam:    No tenderness, masses, or nipple abnormality  Abdomen:     Soft, non-tender, bowel sounds active all four quadrants,    no masses, no organomegaly  Genitalia:    Pelvic: cervix normal in appearance, external genitalia normal, no adnexal masses or tenderness, no cervical motion tenderness, rectovaginal septum normal, uterus normal size,  shape, and consistency and vagina normal without discharge  Extremities:   Right knee with crepitus, no effusion . Extremities normal, atraumatic, no cyanosis or edema  Pulses:   2+ and symmetric all extremities  Skin:   Skin color, texture, turgor normal, no rashes or lesions  Lymph nodes:   Cervical, supraclavicular, and axillary nodes normal  Neurologic:   CNII-XII intact, normal strength, sensation and reflexes    throughout       Assessment & Plan:   Problem List Items Addressed This Visit    RESOLVED: Breast mass, right    Negative diagnostic and ultraound.  Screening bilateral mammogram due in September       Encounter for preventive health examination    Annual comprehensive preventive exam was done as well as an evaluation and management of chronic conditions .  During the course of the visit the patient was educated and counseled about appropriate screening and preventive services including :  diabetes screening, lipid analysis with projected  10 year  risk  for CAD , nutrition counseling, breast, cervical and colorectal cancer screening, and recommended immunizations.  Printed recommendations for health maintenance screenings was given      Overweight (BMI 25.0-29.9)    I have addressed  BMI and recommended a low glycemic index diet utilizing smaller more frequent meals to increase metabolism.  I have also recommended that patient start exercising with a goal of 30 minutes of aerobic exercise a minimum of 5 days per week. Screening for lipid disorders, thyroid and diabetes to be done today.        History of reactive hypoglycemia    Checking A1c today  .lasta1c      Pain and swelling of right knee    Pain is caused by kneeling on it, and a fullness is experienced behind the knee.  Symptoms started after walking a mile recently. .  [place] ordered to rule out Baker's cyst.        Other Visit Diagnoses    Knee pain, chronic, right    -  Primary   Relevant Orders   US  Venous Img Lower Unilateral Right   Sedimentation rate (Completed)   Other fatigue       Relevant Orders   CBC with Differential/Platelet (Completed)   TSH (Completed)   Hepatic function panel (Completed)   Vitamin D deficiency       Relevant Orders   VITAMIN D 25 Hydroxy (Vit-D Deficiency, Fractures) (Completed)   Hyperlipidemia       Relevant Orders   Lipid panel (Completed)   Weight gain       Relevant Orders   TSH (Completed)   Hemoglobin A1c (Completed)   Cervical cancer screening       Relevant Orders   Cytology - PAP   Breast cancer screening       Relevant Orders   MM DIGITAL SCREENING BILATERAL   Need for vaccination with 13-polyvalent pneumococcal conjugate vaccine       Need for diphtheria-tetanus-pertussis (Tdap) vaccine, adult/adolescent       Relevant Orders   Tdap vaccine greater than or equal to 7yo IM (Completed)      I have discontinued Ms. Taliercio's olopatadine. I am also having her maintain her cetirizine, ibuprofen, OVER THE COUNTER MEDICATION, and ALPRAZolam.  No orders of the defined types were placed in this encounter.   Medications Discontinued During This Encounter  Medication Reason  . olopatadine (PATANOL) 0.1 % ophthalmic solution Patient Preference    Follow-up: Return in about 3 months (around 08/27/2016), or FOLLOW UP ON WEIGHT LOSS .   Sherlene Shams, MD

## 2016-05-27 NOTE — Assessment & Plan Note (Addendum)
Annual comprehensive preventive exam was done as well as an evaluation and management of chronic conditions .  During the course of the visit the patient was educated and counseled about appropriate screening and preventive services including :  diabetes screening, lipid analysis with projected  10 year  risk for CAD , nutrition counseling, breast, cervical and colorectal cancer screening, and recommended immunizations.  Printed recommendations for health maintenance screenings was given 

## 2016-05-27 NOTE — Assessment & Plan Note (Signed)
I have addressed  BMI and recommended a low glycemic index diet utilizing smaller more frequent meals to increase metabolism.  I have also recommended that patient start exercising with a goal of 30 minutes of aerobic exercise a minimum of 5 days per week. Screening for lipid disorders, thyroid and diabetes to be done today.   

## 2016-05-27 NOTE — Patient Instructions (Addendum)
YOU ARE DUE FOR MAMMOGRAM In September ;  We will schedule it for you  PAP smear was done today  Fasting labs done today  Tetanus vaccine given today  We discussed your desire to lose 20 lbs with a low carb lifestyle change and regular exercise   For breakfast:  You might want to try a premixed protein drink called Premier Protein shake for breakfast or late night snack . It is great tasting,   very low sugar and available of < $2 serving at [facility] and  In bulk for $1.50/serving at [employer] and [employer]  .    Nutritional analysis :  160 cal  30 g protein  1 g sugar 50% calcium needs   Nicolette Bang and [employer]  Measure your nuts!!!!  Don't overdo it !  Keep your snacks to 100 calories  Try Halo Top chocolate ice cream.  Breyer's Carb control fudgsicle or ice cream bar   Use the breads listed below for cut carbs from your diet:   To make a low carb chip :  Take the Joseph's Lavash or Pita bread,  Or the [place] Low carb whole wheat tortilla   Place on metal cookie sheet  Brush with olive oil  Sprinkle garlic powder (NOT garlic salt), grated parmesan cheese, mediterranean seasoning , or all of them?  Bake at 275 for 30 minutes   We have substitutions for your potatoes!!  Try the mashed cauliflower by [employer] and riced cauliflower dishes instead of rice and mashed potatoes  Mashed turnips are also very low carb . they make FANTASTIC SUBSTITUTES FOR POTATO CHIPS IF YOU FRY THEM !   For desserts :  Try the Dannon Lt n Fit greek yogurt dessert flavors and top with reddi Whip .  8 carbs,  80 calories  Try Oikos Triple Zero [place] Yogurt in the salted caramel, and the coffee flavors  With Whipped Cream for dessert  breyer's low carb ice cream, available in bars (on a stick, better ) or scoopable ice cream  HERE ARE THE LOW CARB  BREAD CHOICES

## 2016-05-29 LAB — CYTOLOGY - PAP

## 2016-06-27 ENCOUNTER — Telehealth: Payer: Self-pay | Admitting: *Deleted

## 2016-06-27 NOTE — Telephone Encounter (Signed)
Spoke with patient, labs were mailed to patient at the beginning of August as they were normal. thanks

## 2016-06-27 NOTE — Telephone Encounter (Signed)
Patient has requested results from her physical on 07/31 Pt contact  [phone]

## 2016-07-21 ENCOUNTER — Other Ambulatory Visit: Payer: Self-pay | Admitting: Internal Medicine

## 2016-08-26 ENCOUNTER — Encounter: Payer: Self-pay | Admitting: Internal Medicine

## 2016-08-26 ENCOUNTER — Ambulatory Visit (INDEPENDENT_AMBULATORY_CARE_PROVIDER_SITE_OTHER): Payer: BC Managed Care – PPO | Admitting: Internal Medicine

## 2016-08-26 ENCOUNTER — Telehealth: Payer: Self-pay | Admitting: Internal Medicine

## 2016-08-26 VITALS — BP 142/94 | HR 92 | Temp 98.1°F | Resp 14 | Wt 177.6 lb

## 2016-08-26 DIAGNOSIS — H6123 Impacted cerumen, bilateral: Secondary | ICD-10-CM

## 2016-08-26 DIAGNOSIS — K2100 Gastro-esophageal reflux disease with esophagitis, without bleeding: Secondary | ICD-10-CM

## 2016-08-26 DIAGNOSIS — J029 Acute pharyngitis, unspecified: Secondary | ICD-10-CM

## 2016-08-26 MED ORDER — PANTOPRAZOLE SODIUM 40 MG PO TBEC: 40.0000 mg | DELAYED_RELEASE_TABLET | Freq: Two times a day (BID) | ORAL | 3 refills | Status: DC

## 2016-08-26 NOTE — Assessment & Plan Note (Signed)
Needs RN visit  For irrigation

## 2016-08-26 NOTE — Patient Instructions (Addendum)
I think you are dealing with reflux esophagitis   Continue using Flonase  Daily   Adding nexium (PPI) once daily  For acid suppression  No hydrogen peroxide gargles any more  Gargle with warm mildly  salty water for sore throat   Add Neil Med's sinus rinse to help clear  Your sinus passages   If no better in 4 weeks,  We'll have ENT see you to look at your vocal cords and esophagus     Your eardrums are blocked by ear wax I recommend using Debrox ear drops in each ear , and make an appointment for irrigation (need nurse visit )

## 2016-08-26 NOTE — Progress Notes (Signed)
Subjective:  Patient ID: Victoria Gilmore Acton, female    DOB: 1976/05/31  Age: 40 y.o. MRN: [id number]  CC: The primary encounter diagnosis was Reflux esophagitis. Diagnoses of Bilateral impacted cerumen and Acute pharyngitis, unspecified etiology were also pertinent to this visit.  HPI Victoria Gilmore Schartz presents for throat pain of one month duration.  Started with vral URI symptoms  Of sore throat,  Headache,  Hoarseness.  Started taking allergy medicine, which  Dried out her throat (zyrtec and flonase) so she stopped the allergy medicine for Week 2,  But started having PND Week 3 started having acid reflux and nocturnal vomiting of food the first night,  2nd night acid , had eaten spicy food with onion  Week 4 still feellng like there's a lump in her throat, feels like she has phlegm  Gargles with peroxide which helped transiently.  Tried hot te with lemon and honey which has helped   Symptoms are intermittent but occur daily.   Not taking any acid reflux medication in years.   Outpatient Medications Prior to Visit  Medication Sig Dispense Refill  . cetirizine (ZYRTEC) 10 MG tablet Take 10 mg by mouth as needed for allergies. Reported on 02/14/2016    . fluticasone (FLONASE) 50 MCG/ACT nasal spray USE TWO SPRAYS IN EACH NOSTRIL ONCE DAILY 16 g 6  . ibuprofen (ADVIL,MOTRIN) 200 MG tablet Take 400 mg by mouth every 6 (six) hours as needed for moderate pain.    Marland Kitchen. OVER THE COUNTER MEDICATION Apply 1 application topically daily as needed (for ringworm). Reported on 02/14/2016    . ALPRAZolam (XANAX) 0.5 MG tablet TAKE ONE TABLET BY MOUTH AT BEDTIME AS NEEDED FOR ANXIETY 30 tablet 2   No facility-administered medications prior to visit.     Review of Systems;  Patient denies headache, fevers, malaise, unintentional weight loss, skin rash, eye pain, sinus congestion and sinus pain, sore throat, dysphagia,  hemoptysis , cough, dyspnea, wheezing, chest pain, palpitations, orthopnea, edema,  abdominal pain, nausea, melena, diarrhea, constipation, flank pain, dysuria, hematuria, urinary  Frequency, nocturia, numbness, tingling, seizures,  Focal weakness, Loss of consciousness,  Tremor, insomnia, depression, anxiety, and suicidal ideation.      Objective:  BP (!) 142/94   Pulse 92   Temp 98.1 F (36.7 C) (Oral)   Resp 14   Wt 177 lb 9.6 oz (80.6 kg)   LMP 08/06/2016   SpO2 99%   BMI 28.24 kg/m   BP Readings from Last 3 Encounters:  08/26/16 (!) 142/94  05/27/16 102/60  02/14/16 126/78    Wt Readings from Last 3 Encounters:  08/26/16 177 lb 9.6 oz (80.6 kg)  05/27/16 186 lb 6 oz (84.5 kg)  02/14/16 179 lb 12 oz (81.5 kg)    General appearance: alert, cooperative and appears stated age Ears: normal TM's and external ear canals both ears Throat: lips, mucosa, and tongue normal; teeth and gums normal Neck: no adenopathy, no carotid bruit, supple, symmetrical, trachea midline and thyroid not enlarged, symmetric, no tenderness/mass/nodules Back: symmetric, no curvature. ROM normal. No CVA tenderness. Lungs: clear to auscultation bilaterally Heart: regular rate and rhythm, S1, S2 normal, no murmur, click, rub or gallop Abdomen: soft, non-tender; bowel sounds normal; no masses,  no organomegaly Pulses: 2+ and symmetric Skin: Skin color, texture, turgor normal. No rashes or lesions Lymph nodes: Cervical, supraclavicular, and axillary nodes normal.  Lab Results  Component Value Date   HGBA1C 5.1 05/27/2016   HGBA1C 5.4 05/06/2013  Lab Results  Component Value Date   CREATININE 0.67 02/14/2016   CREATININE 0.63 05/25/2015   CREATININE 0.67 11/05/2014    Lab Results  Component Value Date   WBC 5.0 05/27/2016   HGB 11.7 (L) 05/27/2016   HCT 35.1 (L) 05/27/2016   PLT 259.0 05/27/2016   GLUCOSE 98 02/14/2016   CHOL 141 05/27/2016   TRIG 41.0 05/27/2016   HDL 58.70 05/27/2016   LDLDIRECT 89.4 04/10/2011   LDLCALC 74 05/27/2016   ALT 14 05/27/2016   AST  17 05/27/2016   NA 137 02/14/2016   K 3.7 02/14/2016   CL 106 02/14/2016   CREATININE 0.67 02/14/2016   BUN 14 02/14/2016   CO2 25 02/14/2016   TSH 0.93 05/27/2016   HGBA1C 5.1 05/27/2016    No results found.  Assessment & Plan:   Problem List Items Addressed This Visit    Pharyngitis, acute    History suggests refllux esophagitis.  Trial of PPI x 4 weeks,  If No improvememt,refer to ENT      Bilateral impacted cerumen    Needs RN visit  For irrigation        Other Visit Diagnoses    Reflux esophagitis    -  Primary     A total of 25 minutes of face to face time was spent with patient more than half of which was spent in counselling about the above mentioned conditions  and coordination of care   I am having Ms. Wishart start on pantoprazole. I am also having her maintain her cetirizine, ibuprofen, OVER THE COUNTER MEDICATION, and fluticasone.  Meds ordered this encounter  Medications  . pantoprazole (PROTONIX) 40 MG tablet    Sig: Take 1 tablet (40 mg total) by mouth 2 (two) times daily before a meal. Before breakfast    Dispense:  60 tablet    Refill:  3    There are no discontinued medications.  Follow-up: Return for RN visit for cerumen impaction bilateral .   Sherlene ShamsULLO, Vitor Overbaugh L, MD

## 2016-08-26 NOTE — Progress Notes (Signed)
Pre visit review using our clinic review tool, if applicable. No additional management support is needed unless otherwise documented below in the visit note. 

## 2016-08-26 NOTE — Telephone Encounter (Signed)
Pt states that she needs a refill on ALPRAZolam (XANAX) 0.5 MG tablet sent to [facility] on  [address]

## 2016-08-27 ENCOUNTER — Telehealth: Payer: Self-pay | Admitting: *Deleted

## 2016-08-27 DIAGNOSIS — J029 Acute pharyngitis, unspecified: Secondary | ICD-10-CM | POA: Insufficient documentation

## 2016-08-27 MED ORDER — ALPRAZOLAM 0.5 MG PO TABS: ORAL_TABLET | ORAL | 2 refills | Status: DC

## 2016-08-27 NOTE — Telephone Encounter (Signed)
Pt's husband was notified that rx was faxed.

## 2016-08-27 NOTE — Assessment & Plan Note (Addendum)
History suggests refllux esophagitis.  Trial of PPI x 4 weeks,  If No improvememt,refer to ENT

## 2016-08-27 NOTE — Telephone Encounter (Signed)
Please call pt when Rx is ready.

## 2016-08-27 NOTE — Telephone Encounter (Signed)
Script printed & awaiting MD signature 

## 2016-09-06 ENCOUNTER — Ambulatory Visit (INDEPENDENT_AMBULATORY_CARE_PROVIDER_SITE_OTHER): Payer: BC Managed Care – PPO

## 2016-09-06 DIAGNOSIS — H6123 Impacted cerumen, bilateral: Secondary | ICD-10-CM

## 2016-09-06 NOTE — Progress Notes (Signed)
Patient comes in for bilateral ear irrigation  Patient was recommended to follow-up after she was seen on 08/16/2016 after using Debrox.  Both ears irrigated patient tolerated procedure well.

## 2016-09-08 NOTE — Progress Notes (Signed)
  I have reviewed the above information and agree with above.   Elizah Mierzwa, MD 

## 2016-11-15 ENCOUNTER — Ambulatory Visit (INDEPENDENT_AMBULATORY_CARE_PROVIDER_SITE_OTHER): Payer: BC Managed Care – PPO | Admitting: Internal Medicine

## 2016-11-15 ENCOUNTER — Encounter: Payer: Self-pay | Admitting: Internal Medicine

## 2016-11-15 ENCOUNTER — Other Ambulatory Visit: Payer: Self-pay | Admitting: Internal Medicine

## 2016-11-15 VITALS — BP 108/66 | HR 101 | Temp 98.3°F | Resp 18 | Ht 66.0 in | Wt 181.0 lb

## 2016-11-15 DIAGNOSIS — M545 Low back pain, unspecified: Secondary | ICD-10-CM

## 2016-11-15 DIAGNOSIS — R350 Frequency of micturition: Secondary | ICD-10-CM

## 2016-11-15 DIAGNOSIS — E040 Nontoxic diffuse goiter: Secondary | ICD-10-CM

## 2016-11-15 DIAGNOSIS — Z23 Encounter for immunization: Secondary | ICD-10-CM | POA: Diagnosis not present

## 2016-11-15 DIAGNOSIS — J029 Acute pharyngitis, unspecified: Secondary | ICD-10-CM | POA: Diagnosis not present

## 2016-11-15 DIAGNOSIS — J020 Streptococcal pharyngitis: Secondary | ICD-10-CM

## 2016-11-15 LAB — POCT URINALYSIS DIPSTICK
Bilirubin, UA: NEGATIVE
Glucose, UA: NEGATIVE
KETONES UA: NEGATIVE
LEUKOCYTES UA: NEGATIVE
Nitrite, UA: NEGATIVE
PH UA: 7
PROTEIN UA: NEGATIVE
Spec Grav, UA: 1.02
Urobilinogen, UA: 0.2

## 2016-11-15 LAB — POCT RAPID STREP A (OFFICE): RAPID STREP A SCREEN: NEGATIVE

## 2016-11-15 NOTE — Progress Notes (Signed)
Pre-visit discussion using our clinic review tool. No additional management support is needed unless otherwise documented below in the visit note.  

## 2016-11-15 NOTE — Patient Instructions (Addendum)
I think your sore throat and hoarseness are due to multiple CHRONIC CAUSES THAT NEED TO BE MANAGED LONG TERM;  1) SNORING 2) POST NASAL DRIP 3) REFLUX   I recommend the following:   lycra chin strap a t night to snop snoring  Gargle with salt water every morning   Add famotidine 20 mg twice daily   (acid reflux)   Continue zyrtec and dymista  GI referral to evaluate for reflux

## 2016-11-15 NOTE — Progress Notes (Signed)
Subjective:  Patient ID: Victoria Gilmore Regner, female    DOB: 11-02-1975  Age: 41 y.o. MRN: [id number]  CC: The primary encounter diagnosis was Frequent urination. Diagnoses of Streptococcal sore throat, Acute bilateral low back pain without sciatica, Sore throat in the morning, Encounter for immunization, and Acute pharyngitis, unspecified etiology were also pertinent to this visit.  HPI Victoria Gilmore Hedges presents for evaluation of multiple complaints.  1) she has had recurrent episode sore throat,  Without fevers or sinus pain Octoer. The sore throat is worse in the morning had transiently resolves but returns .  She reports post nasal drip without sneezing or eye irritation.   She had no improvement with treatment of GERD with protonix; states that it made her nauseated.   Saw ENT.  Vocal cords enflamed , added singulair and dymista, but didn't tolerate nighttime dose of singulair because It made her feel "loopy" . Resumed zyrtec . She reduced the protonix from bid to once daily bc it was not helping the nuasea and occasional vomoitng at night, , then discontinued it completely.  currently just taking dymista and zyrtec.  Changed her diet to reduce the acid .  Symptoms have generally improved since change in therapy, and she ne longer feels a lump in her throat.  . Changed diet.  However, this morning when she woke up with a really sore throat.   Concerned that she had streop throat back in October before coming to see me because she has a history of getting scarlet fever whenever she gets strep, .  Marland Kitchen.    2) bilateral lateral low Back pain with urinary frequency present for 48 hours.  No history of stones,  Travel,  Recent fall, or unusual activity.   Outpatient Medications Prior to Visit  Medication Sig Dispense Refill  . ALPRAZolam (XANAX) 0.5 MG tablet TAKE ONE TABLET BY MOUTH AT BEDTIME AS NEEDED FOR ANXIETY 30 tablet 2  . cetirizine (ZYRTEC) 10 MG tablet Take 10 mg by mouth as needed for  allergies. Reported on 02/14/2016    . fluticasone (FLONASE) 50 MCG/ACT nasal spray USE TWO SPRAYS IN EACH NOSTRIL ONCE DAILY 16 g 6  . ibuprofen (ADVIL,MOTRIN) 200 MG tablet Take 400 mg by mouth every 6 (six) hours as needed for moderate pain.    Marland Kitchen. OVER THE COUNTER MEDICATION Apply 1 application topically daily as needed (for ringworm). Reported on 02/14/2016    . pantoprazole (PROTONIX) 40 MG tablet Take 1 tablet (40 mg total) by mouth 2 (two) times daily before a meal. Before breakfast 60 tablet 3   No facility-administered medications prior to visit.     Review of Systems;  Patient denies headache, fevers, malaise, unintentional weight loss, skin rash, eye pain, sinus congestion and sinus pain, sore throat, dysphagia,  hemoptysis , cough, dyspnea, wheezing, chest pain, palpitations, orthopnea, edema, abdominal pain, nausea, melena, diarrhea, constipation, flank pain, dysuria, hematuria, urinary  Frequency, nocturia, numbness, tingling, seizures,  Focal weakness, Loss of consciousness,  Tremor, insomnia, depression, anxiety, and suicidal ideation.      Objective:  BP 108/66   Pulse (!) 101   Temp 98.3 F (36.8 C) (Oral)   Resp 18   Ht 5\' 6"  (1.676 m)   Wt 181 lb (82.1 kg)   LMP 11/15/2016   SpO2 97%   BMI 29.21 kg/m   BP Readings from Last 3 Encounters:  11/15/16 108/66  08/26/16 (!) 142/94  05/27/16 102/60    Wt Readings from Last 3  Encounters:  11/15/16 181 lb (82.1 kg)  08/26/16 177 lb 9.6 oz (80.6 kg)  05/27/16 186 lb 6 oz (84.5 kg)    General appearance: alert, cooperative and appears stated age Ears: normal TM's and external ear canals both ears Throat: mild tonsillar erythema without edema or ulcerations,  tongue normal; teeth and gums normal Neck: no adenopathy, no carotid bruit, supple, symmetrical, trachea midline and thyroid not enlarged, symmetric, no tenderness/mass/nodules Back: symmetric, no curvature. ROM normal. No CVA tenderness. Lungs: clear to  auscultation bilaterally Heart: regular rate and rhythm, S1, S2 normal, no murmur, click, rub or gallop Abdomen: soft, non-tender; bowel sounds normal; no masses,  no organomegaly Pulses: 2+ and symmetric Skin: Skin color, texture, turgor normal. No rashes or lesions Lymph nodes: Cervical, supraclavicular, and axillary nodes normal.  Lab Results  Component Value Date   HGBA1C 5.1 05/27/2016   HGBA1C 5.4 05/06/2013    Lab Results  Component Value Date   CREATININE 0.67 02/14/2016   CREATININE 0.63 05/25/2015   CREATININE 0.67 11/05/2014    Lab Results  Component Value Date   WBC 5.0 05/27/2016   HGB 11.7 (L) 05/27/2016   HCT 35.1 (L) 05/27/2016   PLT 259.0 05/27/2016   GLUCOSE 98 02/14/2016   CHOL 141 05/27/2016   TRIG 41.0 05/27/2016   HDL 58.70 05/27/2016   LDLDIRECT 89.4 04/10/2011   LDLCALC 74 05/27/2016   ALT 14 05/27/2016   AST 17 05/27/2016   NA 137 02/14/2016   K 3.7 02/14/2016   CL 106 02/14/2016   CREATININE 0.67 02/14/2016   BUN 14 02/14/2016   CO2 25 02/14/2016   TSH 0.93 05/27/2016   HGBA1C 5.1 05/27/2016    No results found.  Assessment & Plan:   Problem List Items Addressed This Visit    Back pain    Reassured that there was no evidence of UTI, etiology likely MSK strain       Relevant Orders   Urinalysis, Routine w reflex microscopic (Completed)   Urine culture (Completed)   Pharyngitis, acute    Recurrent.  With negative strep test and normal exam.  Explained that the etiology appears to be noninfectious and multifactorial/chronic etiologis including snoring,  Post nasal drip and GERD.  recommended use of chin strap  To prevent snoring, resume PPI or H2 blocker twice daily and continue zyrtec/dymista .  GI referral        Other Visit Diagnoses    Frequent urination    -  Primary   Relevant Orders   POCT urinalysis dipstick (Completed)   Urinalysis, Routine w reflex microscopic (Completed)   Urine culture (Completed)   Streptococcal  sore throat       Relevant Orders   POCT rapid strep A (Completed)   Sore throat in the morning       Relevant Orders   Ambulatory referral to Gastroenterology   Encounter for immunization       Relevant Orders   Flu Vaccine QUAD 36+ mos IM (Completed)      I have discontinued Ms. Winiarski's pantoprazole. I am also having her maintain her cetirizine, ibuprofen, OVER THE COUNTER MEDICATION, fluticasone, ALPRAZolam, and azelastine.  Meds ordered this encounter  Medications  . azelastine (ASTELIN) 0.1 % nasal spray    Sig: Place 2 sprays into both nostrils 2 (two) times daily.    Medications Discontinued During This Encounter  Medication Reason  . pantoprazole (PROTONIX) 40 MG tablet     Follow-up: No Follow-up on file.  Crecencio Mc, MD

## 2016-11-16 LAB — URINALYSIS, ROUTINE W REFLEX MICROSCOPIC
Bilirubin Urine: NEGATIVE
Glucose, UA: NEGATIVE
HGB URINE DIPSTICK: NEGATIVE
Ketones, ur: NEGATIVE
LEUKOCYTES UA: NEGATIVE
NITRITE: NEGATIVE
PH: 7 (ref 5.0–8.0)
Protein, ur: NEGATIVE
SPECIFIC GRAVITY, URINE: 1.024 (ref 1.001–1.035)

## 2016-11-17 DIAGNOSIS — M549 Dorsalgia, unspecified: Secondary | ICD-10-CM | POA: Insufficient documentation

## 2016-11-17 LAB — URINE CULTURE

## 2016-11-17 NOTE — Assessment & Plan Note (Signed)
Recurrent.  With negative strep test and normal exam.  Explained that the etiology appears to be noninfectious and multifactorial/chronic etiologis including snoring,  Post nasal drip and GERD.  recommended use of chin strap  To prevent snoring, resume PPI or H2 blocker twice daily and continue zyrtec/dymista .  GI referral

## 2016-11-17 NOTE — Assessment & Plan Note (Signed)
Reassured that there was no evidence of UTI, etiology likely MSK strain

## 2016-11-18 ENCOUNTER — Telehealth: Payer: Self-pay | Admitting: Internal Medicine

## 2016-11-18 NOTE — Telephone Encounter (Signed)
Pt called returning your call regarding lab result. Thank you! °

## 2016-11-19 ENCOUNTER — Ambulatory Visit: Payer: BC Managed Care – PPO

## 2016-11-21 ENCOUNTER — Ambulatory Visit
Admission: RE | Admit: 2016-11-21 | Discharge: 2016-11-21 | Disposition: A | Discharge status: Home / Self Care | Payer: BC Managed Care – PPO | Source: Ambulatory Visit | Attending: Internal Medicine | Admitting: Internal Medicine

## 2016-11-21 DIAGNOSIS — E040 Nontoxic diffuse goiter: Secondary | ICD-10-CM | POA: Insufficient documentation

## 2016-11-28 ENCOUNTER — Other Ambulatory Visit: Payer: Self-pay

## 2016-11-28 ENCOUNTER — Ambulatory Visit: Payer: Self-pay | Admitting: Gastroenterology

## 2016-12-17 ENCOUNTER — Ambulatory Visit: Payer: Self-pay | Admitting: Gastroenterology

## 2017-01-21 ENCOUNTER — Other Ambulatory Visit: Payer: Self-pay | Admitting: Internal Medicine

## 2017-01-21 DIAGNOSIS — N644 Mastodynia: Secondary | ICD-10-CM

## 2017-01-24 ENCOUNTER — Ambulatory Visit
Admission: RE | Admit: 2017-01-24 | Discharge: 2017-01-24 | Disposition: A | Discharge status: Home / Self Care | Payer: BC Managed Care – PPO | Source: Ambulatory Visit | Attending: Internal Medicine | Admitting: Internal Medicine

## 2017-01-24 DIAGNOSIS — N644 Mastodynia: Secondary | ICD-10-CM

## 2017-03-02 ENCOUNTER — Other Ambulatory Visit: Payer: Self-pay | Admitting: Internal Medicine

## 2017-03-03 ENCOUNTER — Telehealth: Payer: Self-pay

## 2017-03-03 NOTE — Telephone Encounter (Signed)
Last OV was in 11/15/16 no follow up scheduled, last refills was 07/30/16 with #30 and 2 refills, please advise, thanks

## 2017-03-03 NOTE — Telephone Encounter (Signed)
What is the request for?

## 2017-03-03 NOTE — Telephone Encounter (Signed)
Refilled: 08/27/2016 Last OV: 11/15/2016 Next OV: not scheduled

## 2017-03-03 NOTE — Telephone Encounter (Signed)
Pt called to follow up on the Rx. Thank you! °

## 2017-03-04 MED ORDER — ALPRAZOLAM 0.5 MG PO TABS: ORAL_TABLET | ORAL | 2 refills | Status: DC

## 2017-03-04 NOTE — Telephone Encounter (Signed)
Sorry. It's for the xanax.

## 2017-03-04 NOTE — Telephone Encounter (Signed)
Has been signed

## 2017-03-04 NOTE — Telephone Encounter (Signed)
Refilled until July,  Needs OV for subsequent refills

## 2017-03-04 NOTE — Telephone Encounter (Signed)
Spoke with pt and she stated that she would call back and schedule at appt to see Dr. Darrick Huntsmanullo sometime over the summer. Pt is aware that after July Dr. Darrick Huntsmanullo will not be refilling the xanax until she is seen by her. Pt gave a verbal understanding.

## 2017-03-06 ENCOUNTER — Encounter: Payer: Self-pay | Admitting: Internal Medicine

## 2017-03-07 NOTE — Telephone Encounter (Signed)
Printed signed and faxed

## 2018-04-27 ENCOUNTER — Telehealth: Payer: Self-pay

## 2018-04-27 NOTE — Telephone Encounter (Signed)
Copied from CRM [id number]. Topic: Appointment Scheduling - Scheduling Inquiry for Clinic >> Apr 24, 2018  3:23 PM Lorrine KinMcGee, Demi B, [place] wrote: Reason for CRM: Patient requesting to switch providers from Dr Darrick Huntsmanullo to Dr Shirlee LatchMcLean. Please advise.

## 2018-04-27 NOTE — Telephone Encounter (Signed)
Will Dr. Shirlee LatchMclean accept pt as a new pt?

## 2018-04-27 NOTE — Telephone Encounter (Signed)
FYI

## 2018-04-27 NOTE — Telephone Encounter (Signed)
That's fine.  I will not be accepting her back once she has made the switch.

## 2018-04-28 NOTE — Telephone Encounter (Signed)
Dr. Darrick Huntsmanullo is ok with pt switching to Dr. Shirlee LatchMclean and Dr. Shirlee LatchMclean stated that it is okay.

## 2018-04-28 NOTE — Telephone Encounter (Signed)
[facility] is [facility]   TMS

## 2018-04-28 NOTE — Telephone Encounter (Signed)
The switch has been made.

## 2018-05-08 ENCOUNTER — Other Ambulatory Visit: Payer: Self-pay | Admitting: Internal Medicine

## 2018-05-08 DIAGNOSIS — F419 Anxiety disorder, unspecified: Secondary | ICD-10-CM

## 2018-05-08 MED ORDER — ALPRAZOLAM 0.5 MG PO TABS: ORAL_TABLET | ORAL | 2 refills | Status: DC

## 2018-05-26 ENCOUNTER — Other Ambulatory Visit: Payer: Self-pay | Admitting: Internal Medicine

## 2018-05-26 ENCOUNTER — Ambulatory Visit: Payer: BC Managed Care – PPO | Admitting: Internal Medicine

## 2018-05-26 ENCOUNTER — Encounter: Payer: Self-pay | Admitting: Internal Medicine

## 2018-05-26 ENCOUNTER — Telehealth: Payer: Self-pay | Admitting: Radiology

## 2018-05-26 ENCOUNTER — Ambulatory Visit: Payer: BC Managed Care – PPO

## 2018-05-26 VITALS — BP 116/78 | HR 79 | Temp 98.4°F | Ht 66.0 in | Wt 193.2 lb

## 2018-05-26 DIAGNOSIS — Z111 Encounter for screening for respiratory tuberculosis: Secondary | ICD-10-CM

## 2018-05-26 DIAGNOSIS — Z1329 Encounter for screening for other suspected endocrine disorder: Secondary | ICD-10-CM

## 2018-05-26 DIAGNOSIS — E559 Vitamin D deficiency, unspecified: Secondary | ICD-10-CM | POA: Diagnosis not present

## 2018-05-26 DIAGNOSIS — F419 Anxiety disorder, unspecified: Secondary | ICD-10-CM | POA: Diagnosis not present

## 2018-05-26 DIAGNOSIS — R319 Hematuria, unspecified: Secondary | ICD-10-CM

## 2018-05-26 DIAGNOSIS — Z139 Encounter for screening, unspecified: Secondary | ICD-10-CM

## 2018-05-26 DIAGNOSIS — Z13220 Encounter for screening for lipoid disorders: Secondary | ICD-10-CM | POA: Diagnosis not present

## 2018-05-26 DIAGNOSIS — Z1159 Encounter for screening for other viral diseases: Secondary | ICD-10-CM

## 2018-05-26 DIAGNOSIS — Z0000 Encounter for general adult medical examination without abnormal findings: Secondary | ICD-10-CM | POA: Diagnosis not present

## 2018-05-26 DIAGNOSIS — Z1231 Encounter for screening mammogram for malignant neoplasm of breast: Secondary | ICD-10-CM

## 2018-05-26 DIAGNOSIS — K219 Gastro-esophageal reflux disease without esophagitis: Secondary | ICD-10-CM | POA: Insufficient documentation

## 2018-05-26 DIAGNOSIS — Z1389 Encounter for screening for other disorder: Secondary | ICD-10-CM

## 2018-05-26 DIAGNOSIS — E669 Obesity, unspecified: Secondary | ICD-10-CM | POA: Insufficient documentation

## 2018-05-26 DIAGNOSIS — Z0184 Encounter for antibody response examination: Secondary | ICD-10-CM

## 2018-05-26 DIAGNOSIS — M5416 Radiculopathy, lumbar region: Secondary | ICD-10-CM

## 2018-05-26 LAB — CBC WITH DIFFERENTIAL/PLATELET
BASOS PCT: 0.7 % (ref 0.0–3.0)
Basophils Absolute: 0 10*3/uL (ref 0.0–0.1)
EOS PCT: 2.5 % (ref 0.0–5.0)
Eosinophils Absolute: 0.1 10*3/uL (ref 0.0–0.7)
HCT: 35.7 % — ABNORMAL LOW (ref 36.0–46.0)
Hemoglobin: 12 g/dL (ref 12.0–15.0)
Lymphocytes Relative: 33.6 % (ref 12.0–46.0)
Lymphs Abs: 1.5 10*3/uL (ref 0.7–4.0)
MCHC: 33.7 g/dL (ref 30.0–36.0)
MCV: 94 fl (ref 78.0–100.0)
MONOS PCT: 7.4 % (ref 3.0–12.0)
Monocytes Absolute: 0.3 10*3/uL (ref 0.1–1.0)
Neutro Abs: 2.4 10*3/uL (ref 1.4–7.7)
Neutrophils Relative %: 55.8 % (ref 43.0–77.0)
Platelets: 247 10*3/uL (ref 150.0–400.0)
RBC: 3.8 Mil/uL — AB (ref 3.87–5.11)
RDW: 12.4 % (ref 11.5–15.5)
WBC: 4.4 10*3/uL (ref 4.0–10.5)

## 2018-05-26 LAB — COMPREHENSIVE METABOLIC PANEL
ALK PHOS: 47 U/L (ref 39–117)
ALT: 10 U/L (ref 0–35)
AST: 12 U/L (ref 0–37)
Albumin: 3.9 g/dL (ref 3.5–5.2)
BUN: 14 mg/dL (ref 6–23)
CHLORIDE: 106 meq/L (ref 96–112)
CO2: 24 meq/L (ref 19–32)
Calcium: 8.8 mg/dL (ref 8.4–10.5)
Creatinine, Ser: 0.63 mg/dL (ref 0.40–1.20)
GFR: 133.32 mL/min (ref >60.00)
Glucose, Bld: 84 mg/dL (ref 70–99)
POTASSIUM: 3.6 meq/L (ref 3.5–5.1)
Sodium: 136 mEq/L (ref 135–145)
Total Bilirubin: 0.4 mg/dL (ref 0.2–1.2)
Total Protein: 7.3 g/dL (ref 6.0–8.3)

## 2018-05-26 LAB — LIPID PANEL
CHOLESTEROL: 131 mg/dL (ref 0–200)
HDL: 49 mg/dL (ref >39.00)
LDL Cholesterol: 73 mg/dL (ref 0–99)
NONHDL: 82.05
Total CHOL/HDL Ratio: 3
Triglycerides: 47 mg/dL (ref 0.0–149.0)
VLDL: 9.4 mg/dL (ref 0.0–40.0)

## 2018-05-26 LAB — VITAMIN D 25 HYDROXY (VIT D DEFICIENCY, FRACTURES): VITD: 21.75 ng/mL — ABNORMAL LOW (ref 30.00–100.00)

## 2018-05-26 LAB — TSH: TSH: 0.73 u[IU]/mL (ref 0.35–4.50)

## 2018-05-26 LAB — T4, FREE: FREE T4: 1.03 ng/dL (ref 0.60–1.60)

## 2018-05-26 MED ORDER — CHOLECALCIFEROL 1.25 MG (50000 UT) PO CAPS: 50000.0000 [IU] | ORAL_CAPSULE | ORAL | 1 refills | Status: DC

## 2018-05-26 NOTE — Progress Notes (Signed)
Pre visit review using our clinic review tool, if applicable. No additional management support is needed unless otherwise documented below in the visit note. 

## 2018-05-26 NOTE — Progress Notes (Addendum)
Chief Complaint  Patient presents with  . Transitions Of Care  . Annual Exam   Annual with complaints  1. Anxiety increases and decreases depending on what she has going on lack of sleep worse but does not want to try medication for sleep. Xanax in 1/4 th pill and christian meditation helps qhs when cant sleep. She has a lot of anxiety with driving.   2. C/o chronic low back pain left low back reviewed imaging 2011 with degenerative changes L5/S1 rad to left leg at times tries NSAIDS and helps tylenol does not help  3. Reports weight gain trying to change diet and exercise    Review of Systems  Constitutional: Negative for weight loss.  HENT: Negative for hearing loss.   Eyes: Negative for blurred vision.  Respiratory: Negative for shortness of breath.   Cardiovascular: Negative for chest pain.  Gastrointestinal: Negative for abdominal pain.  Genitourinary:       On cycle today   Musculoskeletal: Positive for back pain.  Skin: Negative for rash.  Psychiatric/Behavioral: Negative for depression. The patient is nervous/anxious and has insomnia.    Past Medical History:  Diagnosis Date  . Hematuria    per pt neg work up   No past surgical history on file. Family History  Problem Relation Age of Onset  . Hypertension Father    Social History   Socioeconomic History  . Marital status: Married    Spouse name: Not on file  . Number of children: Not on file  . Years of education: Not on file  . Highest education level: Not on file  Occupational History  . Not on file  Social Needs  . Financial resource strain: Not on file  . Food insecurity:    Worry: Not on file    Inability: Not on file  . Transportation needs:    Medical: Not on file    Non-medical: Not on file  Tobacco Use  . Smoking status: Never Smoker  . Smokeless tobacco: Never Used  Substance and Sexual Activity  . Alcohol use: Yes    Comment: Very rare  . Drug use: No  . Sexual activity: Yes  Lifestyle   . Physical activity:    Days per week: Not on file    Minutes per session: Not on file  . Stress: Not on file  Relationships  . Social connections:    Talks on phone: Not on file    Gets together: Not on file    Attends religious service: Not on file    Active member of club or organization: Not on file    Attends meetings of clubs or organizations: Not on file    Relationship status: Not on file  . Intimate partner violence:    Fear of current or ex partner: Not on file    Emotionally abused: Not on file    Physically abused: Not on file    Forced sexual activity: Not on file  Other Topics Concern  . Not on file  Social History Narrative    graduated with Bachelor's degree in public health 1771 from [employer].   Current Meds  Medication Sig  . ALPRAZolam (XANAX) 0.5 MG tablet TAKE ONE TABLET BY MOUTH AT BEDTIME AS NEEDED FOR ANXIETY  . cetirizine (ZYRTEC) 10 MG tablet Take 10 mg by mouth as needed for allergies. Reported on 02/14/2016  . fluticasone (FLONASE) 50 MCG/ACT nasal spray USE TWO SPRAYS IN EACH NOSTRIL ONCE DAILY  . ibuprofen (ADVIL,MOTRIN)  200 MG tablet Take 400 mg by mouth every 6 (six) hours as needed for moderate pain.  . pantoprazole (PROTONIX) 40 MG tablet    No Known Allergies No results found for this or any previous visit (from the past 2160 hour(s)). Objective  Body mass index is 31.18 kg/m. Wt Readings from Last 3 Encounters:  05/26/18 193 lb 3.2 oz (87.6 kg)  11/15/16 181 lb (82.1 kg)  08/26/16 177 lb 9.6 oz (80.6 kg)   Temp Readings from Last 3 Encounters:  05/26/18 98.4 F (36.9 C) (Oral)  11/15/16 98.3 F (36.8 C) (Oral)  08/26/16 98.1 F (36.7 C) (Oral)   BP Readings from Last 3 Encounters:  05/26/18 116/78  11/15/16 108/66  08/26/16 (!) 142/94   Pulse Readings from Last 3 Encounters:  05/26/18 79  11/15/16 (!) 101  08/26/16 92    Physical Exam  Constitutional: She is oriented to person, place, and time. Vital signs are normal. She  appears well-developed and well-nourished. She is cooperative.  HENT:  Head: Normocephalic and atraumatic.  Mouth/Throat: Oropharynx is clear and moist and mucous membranes are normal.  Eyes: Pupils are equal, round, and reactive to light. Conjunctivae are normal.  Cardiovascular: Normal rate, regular rhythm and normal heart sounds.  Pulmonary/Chest: Effort normal and breath sounds normal. She exhibits no mass, no tenderness, no laceration, no crepitus, no edema, no deformity, no swelling and no retraction. Right breast exhibits no inverted nipple, no mass, no nipple discharge, no skin change and no tenderness. Left breast exhibits no inverted nipple, no mass, no nipple discharge, no skin change and no tenderness. No breast swelling, tenderness, discharge or bleeding. Breasts are symmetrical.  Neurological: She is alert and oriented to person, place, and time. Gait normal.  Skin: Skin is warm, dry and intact.  Psychiatric: She has a normal mood and affect. Her behavior is normal. Judgment and thought content normal. Cognition and memory are normal.  Nursing note and vitals reviewed.   Assessment   1. Annual  2. Anxiety  3. Chronic low back pain rad left lower leg  4. Obesity BMI 31.18  Plan   1.  Fasting labs today  Flu and Tdap utd  [facility] 01[facility]01/[facility] h/o cyclical breast pain with menses  Pap had 05/27/16 neg pap neg HPV h/o abnormal pap remotely  HIV/HCV neg in past  Check Hep B/MMR titer  rec healthy diet choices and exercise to lose  Never smoker   2. Prn Xanax sparingly  3. Xray low back today  rec NSAIDS sparingly and prn Tylenol  Disc [place] or [place] chiropractor  4.  rec healthy diet choices and exercise to lose  [facility]bt[facility] and reviewed  Labs 11/11/16 Urine trace blood  RA w/u 05/13/17 RF neg CCP normal, ANA neg, ESR 12 CRP 3.7 Hep B sAb quant 56 immune   ENT Dr. 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 Olivia Mackie McLean-Scocuzza-[facility]

## 2018-05-26 NOTE — Patient Instructions (Addendum)
Victoria Gilmore or Neurosurgeon for low back pain  Call insurance and see if they will cover pap before 3 years your 3 years 05/28/2019 and If not what Is the cost    Living With Anxiety After being diagnosed with an anxiety disorder, you may be relieved to know why you have felt or behaved a certain way. It is natural to also feel overwhelmed about the treatment ahead and what it will mean for your life. With care and support, you can manage this condition and recover from it. How to cope with anxiety Dealing with stress Stress is your body's reaction to life changes and events, both good and bad. Stress can last just a few hours or it can be ongoing. Stress can play a major role in anxiety, so it is important to learn both how to cope with stress and how to think about it differently. Talk with your health care provider or a counselor to learn more about stress reduction. He or she may suggest some stress reduction techniques, such as:  Music therapy. This can include creating or listening to music that you enjoy and that inspires you.  Mindfulness-based meditation. This involves being aware of your normal breaths, rather than trying to control your breathing. It can be done while sitting or walking.  Centering prayer. This is a kind of meditation that involves focusing on a word, phrase, or sacred image that is meaningful to you and that brings you peace.  Deep breathing. To do this, expand your stomach and inhale slowly through your nose. Hold your breath for 3-5 seconds. Then exhale slowly, allowing your stomach muscles to relax.  Self-talk. This is a skill where you identify thought patterns that lead to anxiety reactions and correct those thoughts.  Muscle relaxation. This involves tensing muscles then relaxing them.  Choose a stress reduction technique that fits your lifestyle and personality. Stress reduction techniques take time and practice. Set aside 5-15 minutes a day to do them.  Therapists can offer training in these techniques. The training may be covered by some insurance plans. Other things you can do to manage stress include:  Keeping a stress diary. This can help you learn what triggers your stress and ways to control your response.  Thinking about how you respond to certain situations. You may not be able to control everything, but you can control your reaction.  Making time for activities that help you relax, and not feeling guilty about spending your time in this way.  Therapy combined with coping and stress-reduction skills provides the best chance for successful treatment. Medicines Medicines can help ease symptoms. Medicines for anxiety include:  Anti-anxiety drugs.  Antidepressants.  Beta-blockers.  Medicines may be used as the main treatment for anxiety disorder, along with therapy, or if other treatments are not working. Medicines should be prescribed by a health care provider. Relationships Relationships can play a big part in helping you recover. Try to spend more time connecting with trusted friends and family members. Consider going to couples counseling, taking family education classes, or going to family therapy. Therapy can help you and others better understand the condition. How to recognize changes in your condition Everyone has a different response to treatment for anxiety. Recovery from anxiety happens when symptoms decrease and stop interfering with your daily activities at home or work. This may mean that you will start to:  Have better concentration and focus.  Sleep better.  Be less irritable.  Have more energy.  Have  improved memory.  It is important to recognize when your condition is getting worse. Contact your health care provider if your symptoms interfere with home or work and you do not feel like your condition is improving. Where to find help and support: You can get help and support from these sources:  Self-help  groups.  Online and [employer] organizations.  A trusted spiritual leader.  Couples counseling.  Family education classes.  Family therapy.  Follow these instructions at home:  Eat a healthy diet that includes plenty of vegetables, fruits, whole grains, low-fat dairy products, and lean protein. Do not eat a lot of foods that are high in solid fats, added sugars, or salt.  Exercise. Most adults should do the following: ? Exercise for at least 150 minutes each week. The exercise should increase your heart rate and make you sweat (moderate-intensity exercise). ? Strengthening exercises at least twice a week.  Cut down on caffeine, tobacco, alcohol, and other potentially harmful substances.  Get the right amount and quality of sleep. Most adults need 7-9 hours of sleep each night.  Make choices that simplify your life.  Take over-the-counter and prescription medicines only as told by your health care provider.  Avoid caffeine, alcohol, and certain over-the-counter cold medicines. These may make you feel worse. Ask your pharmacist which medicines to avoid.  Keep all follow-up visits as told by your health care provider. This is important. Questions to ask your health care provider  Would I benefit from therapy?  How often should I follow up with a health care provider?  How long do I need to take medicine?  Are there any long-term side effects of my medicine?  Are there any alternatives to taking medicine? Contact a health care provider if:  You have a hard time staying focused or finishing daily tasks.  You spend many hours a day feeling worried about everyday life.  You become exhausted by worry.  You start to have headaches, feel tense, or have nausea.  You urinate more than normal.  You have diarrhea. Get help right away if:  You have a racing heart and shortness of breath.  You have thoughts of hurting yourself or others. If you ever feel like you may hurt  yourself or others, or have thoughts about taking your own life, get help right away. You can go to your nearest emergency department or call:  Your local emergency services (911 in the [place]  A suicide crisis helpline, such as the National Suicide Prevention Lifeline at (816)390-92911-845-175-3004. This is open 24-hours a day.  Summary  Taking steps to deal with stress can help calm you.  Medicines cannot cure anxiety disorders, but they can help ease symptoms.  Family, friends, and partners can play a big part in helping you recover from an anxiety disorder. This information is not intended to replace advice given to you by your health care provider. Make sure you discuss any questions you have with your health care provider. Document Released: 10/08/2016 Document Revised: 10/08/2016 Document Reviewed: 10/08/2016 Elsevier Interactive Patient Education  2018 [employer]   Back Pain, Adult Many adults have back pain from time to time. Common causes of back pain include:  A strained muscle or ligament.  Wear and tear (degeneration) of the spinal disks.  Arthritis.  A hit to the back.  Back pain can be short-lived (acute) or last a long time (chronic). A physical exam, lab tests, and imaging studies may be done to find the cause  of your pain. Follow these instructions at home: Managing pain and stiffness  Take over-the-counter and prescription medicines only as told by your health care provider.  If directed, apply heat to the affected area as often as told by your health care provider. Use the heat source that your health care provider recommends, such as a moist heat pack or a heating pad. ? Place a towel between your skin and the heat source. ? Leave the heat on for 20-30 minutes. ? Remove the heat if your skin turns bright red. This is especially important if you are unable to feel pain, heat, or cold. You have a greater risk of getting burned.  If directed, apply ice to the injured  area: ? Put ice in a plastic bag. ? Place a towel between your skin and the bag. ? Leave the ice on for 20 minutes, 2-3 times a day for the first 2-3 days. Activity  Do not stay in bed. Resting more than 1-2 days can delay your recovery.  Take short walks on even surfaces as soon as you are able. Try to increase the length of time you walk each day.  Do not sit, drive, or stand in one place for more than 30 minutes at a time. Sitting or standing for long periods of time can put stress on your back.  Use proper lifting techniques. When you bend and lift, use positions that put less stress on your back: ? [place] your knees. ? Keep the load close to your body. ? Avoid twisting.  Exercise regularly as told by your health care provider. Exercising will help your back heal faster. This also helps prevent back injuries by keeping muscles strong and flexible.  Your health care provider may recommend that you see a physical therapist. This person can help you come up with a safe exercise program. Do any exercises as told by your physical therapist. Lifestyle  Maintain a healthy weight. Extra weight puts stress on your back and makes it difficult to have good posture.  Avoid activities or situations that make you feel anxious or stressed. Learn ways to manage anxiety and stress. One way to manage stress is through exercise. Stress and anxiety increase muscle tension and can make back pain worse. General instructions  Sleep on a firm mattress in a comfortable position. Try lying on your side with your knees slightly bent. If you lie on your back, put a pillow under your knees.  Follow your treatment plan as told by your health care provider. This may include: ? Cognitive or behavioral therapy. ? Acupuncture or massage therapy. ? Meditation or yoga. Contact a health care provider if:  You have pain that is not relieved with rest or medicine.  You have increasing pain going down into your legs  or buttocks.  Your pain does not improve in 2 weeks.  You have pain at night.  You lose weight.  You have a fever or chills. Get help right away if:  You develop new bowel or bladder control problems.  You have unusual weakness or numbness in your arms or legs.  You develop nausea or vomiting.  You develop abdominal pain.  You feel faint. Summary  Many adults have back pain from time to time. A physical exam, lab tests, and imaging studies may be done to find the cause of your pain.  Use proper lifting techniques. When you bend and lift, use positions that put less stress on your back.  Take over-the-counter  and prescription medicines and apply heat or ice as directed by your health care provider. This information is not intended to replace advice given to you by your health care provider. Make sure you discuss any questions you have with your health care provider. Document Released: 10/14/2005 Document Revised: 11/18/2016 Document Reviewed: 11/18/2016 Elsevier Interactive Patient Education  [employer].

## 2018-05-26 NOTE — Telephone Encounter (Signed)
Pt refused lower back xrays. Pt stated she was getting xrays done at her chiropractor and did not want to have to do them again. CMA made aware

## 2018-05-27 ENCOUNTER — Encounter: Payer: Self-pay | Admitting: Internal Medicine

## 2018-05-27 LAB — URINALYSIS, ROUTINE W REFLEX MICROSCOPIC
BILIRUBIN UA: NEGATIVE
GLUCOSE, UA: NEGATIVE
Ketones, UA: NEGATIVE
Leukocytes, UA: NEGATIVE
Nitrite, UA: NEGATIVE
PROTEIN UA: NEGATIVE
SPEC GRAV UA: 1.024 (ref 1.005–1.030)
UUROB: 0.2 mg/dL (ref 0.2–1.0)
pH, UA: 6 (ref 5.0–7.5)

## 2018-05-27 LAB — MICROSCOPIC EXAMINATION
Bacteria, UA: NONE SEEN
Casts: NONE SEEN /lpf

## 2018-05-28 ENCOUNTER — Telehealth: Payer: Self-pay

## 2018-05-28 LAB — QUANTIFERON-TB GOLD PLUS
Mitogen-NIL: >10 IU/mL
NIL: 0.07 [IU]/mL
QuantiFERON-TB Gold Plus: NEGATIVE
TB1-NIL: 0 IU/mL
TB2-NIL: <0 IU/mL

## 2018-05-28 LAB — MEASLES/MUMPS/RUBELLA IMMUNITY
Mumps IgG: 79 AU/mL
RUBEOLA IGG: 33.4 [AU]/ml
Rubella: 6.92 index

## 2018-05-28 LAB — HEPATITIS B SURFACE ANTIBODY, QUANTITATIVE: Hepatitis B-Post: 42 m[IU]/mL (ref >=10)

## 2018-05-28 NOTE — Telephone Encounter (Signed)
I will have to look at these at next appt likely benign/nothing to worry about   TMS

## 2018-05-28 NOTE — Telephone Encounter (Signed)
Patient notices some ?moles on skin raised  in color located on right breast 2 looks like red pimple and on abdomen.  No bleeding or irritation. Forgot to mention at appointment.

## 2018-06-03 ENCOUNTER — Encounter: Payer: BC Managed Care – PPO | Admitting: Internal Medicine

## 2018-06-04 NOTE — Telephone Encounter (Signed)
Patient advised of below and verbalized understanding.  

## 2018-07-06 ENCOUNTER — Ambulatory Visit
Admission: RE | Admit: 2018-07-06 | Discharge: 2018-07-06 | Disposition: A | Discharge status: Home / Self Care | Payer: BC Managed Care – PPO | Source: Ambulatory Visit | Attending: Internal Medicine | Admitting: Internal Medicine

## 2018-07-06 DIAGNOSIS — Z139 Encounter for screening, unspecified: Secondary | ICD-10-CM

## 2018-09-16 IMAGING — US US SOFT TISSUE HEAD/NECK
1 series · 12 of 25 positions shown · non-contrast
Comparison: None.

CLINICAL DATA: 40-year-old female with a history of thyroid goiter

EXAM:
THYROID ULTRASOUND
TECHNIQUE: Ultrasound examination of the thyroid gland and adjacent soft
tissues was performed.

[Series 1: us soft tissue head/neck · 0.06mm/px · 12 of 70 slices shown]
[im 3/70]
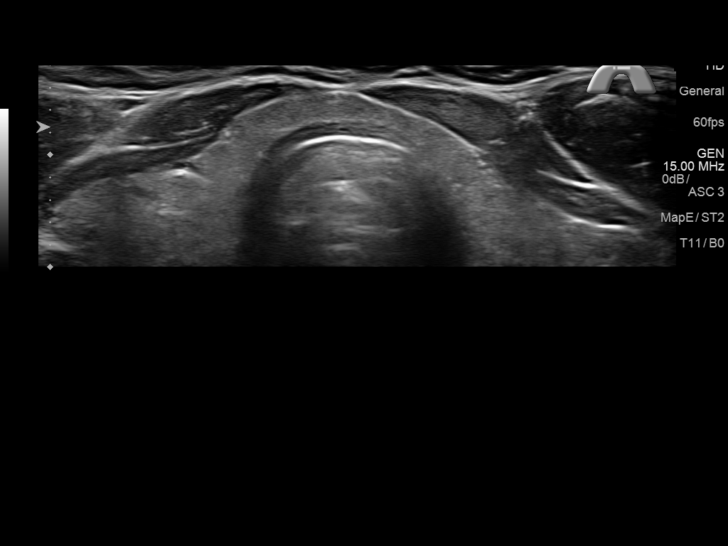
[im 9/70]
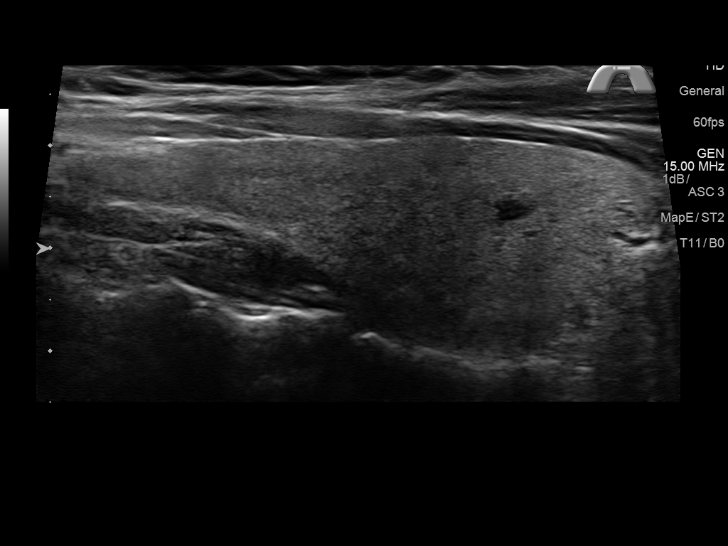
[im 15/70]
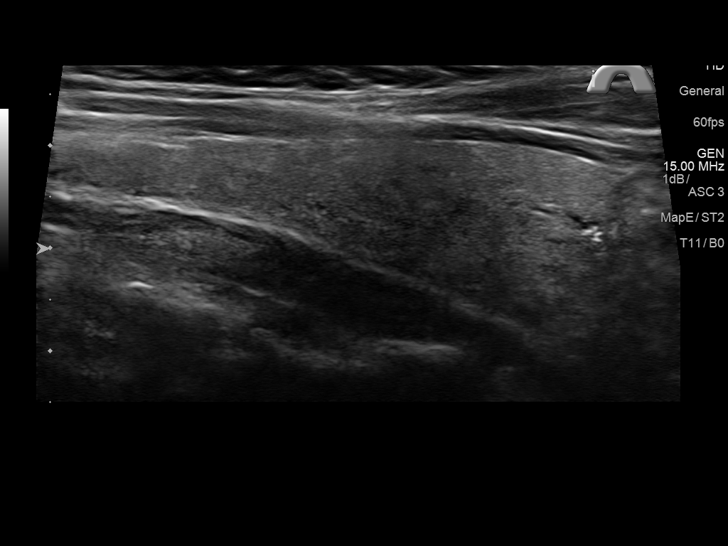
[im 21/70]
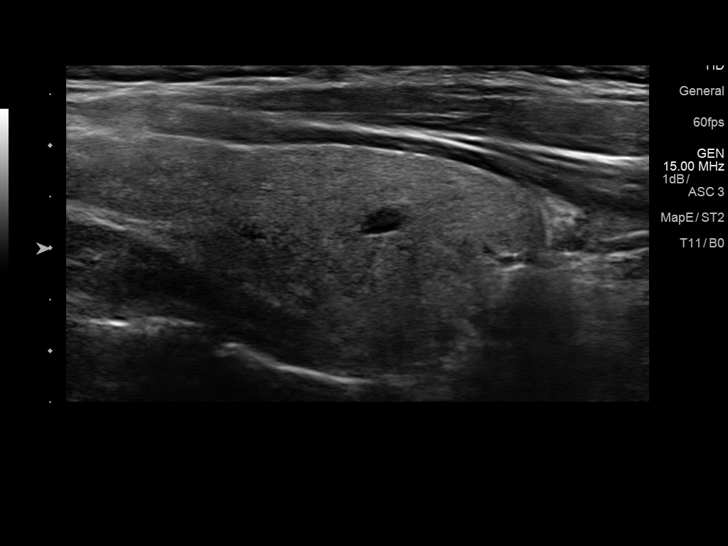
[im 26/70]
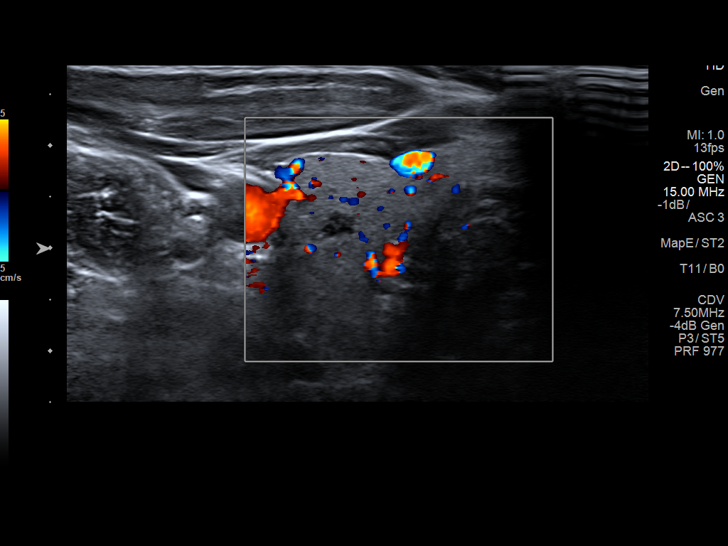
[im 32/70]
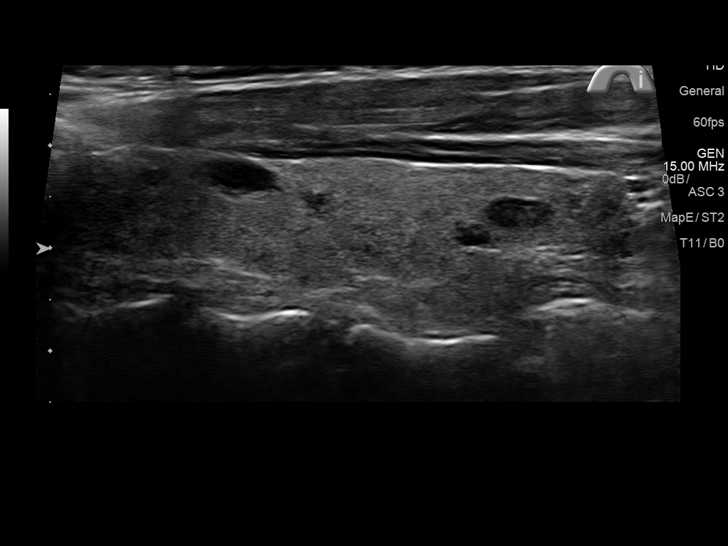
[im 38/70]
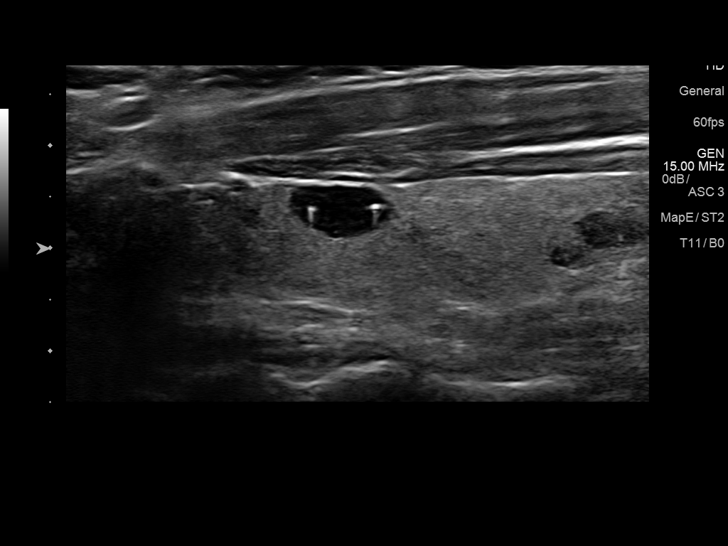
[im 44/70]
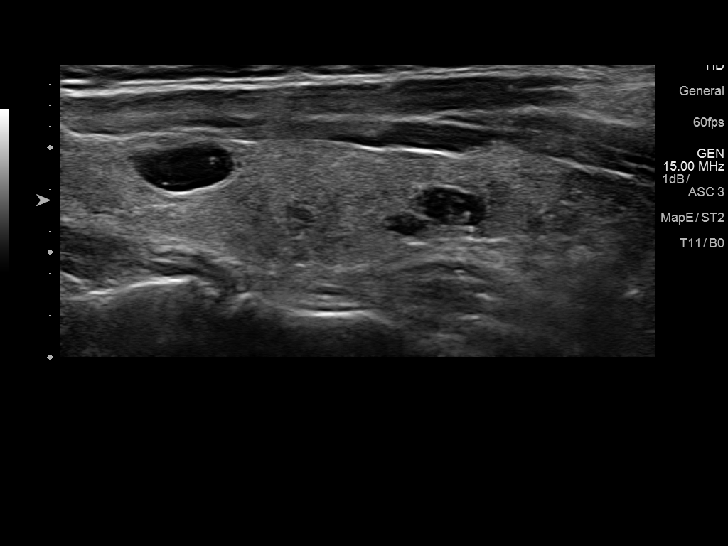
[im 49/70]
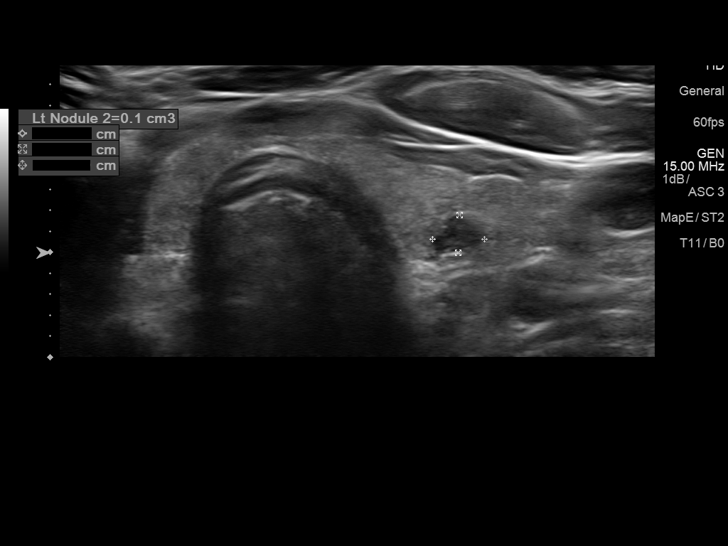
[im 55/70]
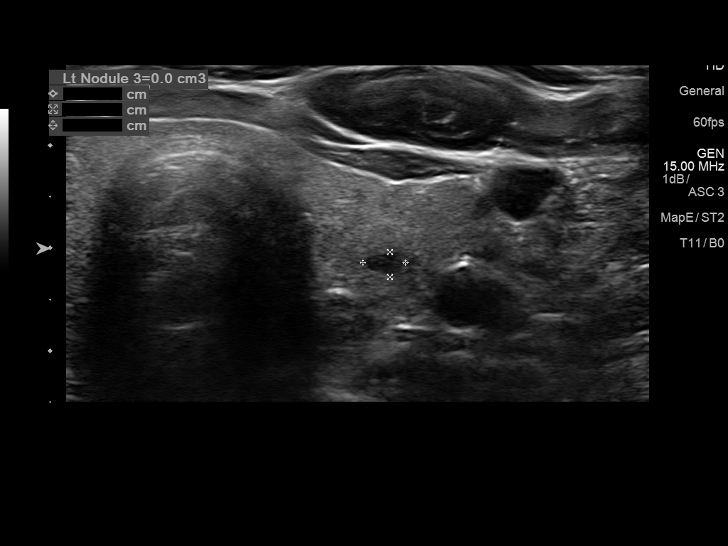
[im 61/70]
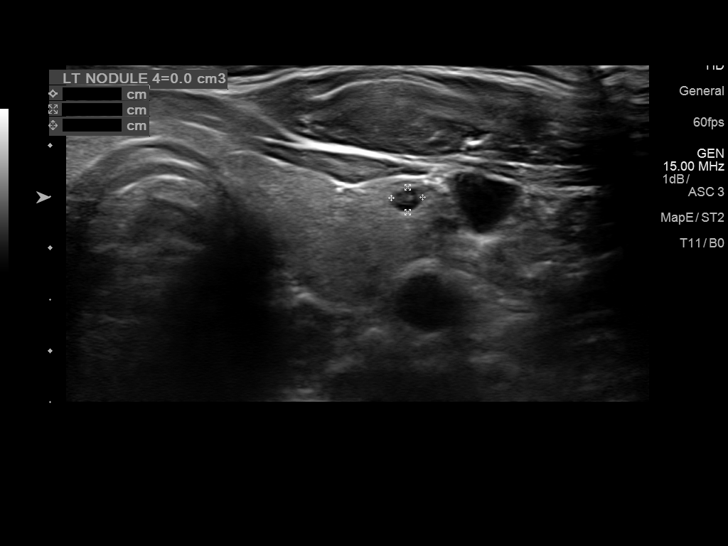
[im 67/70]
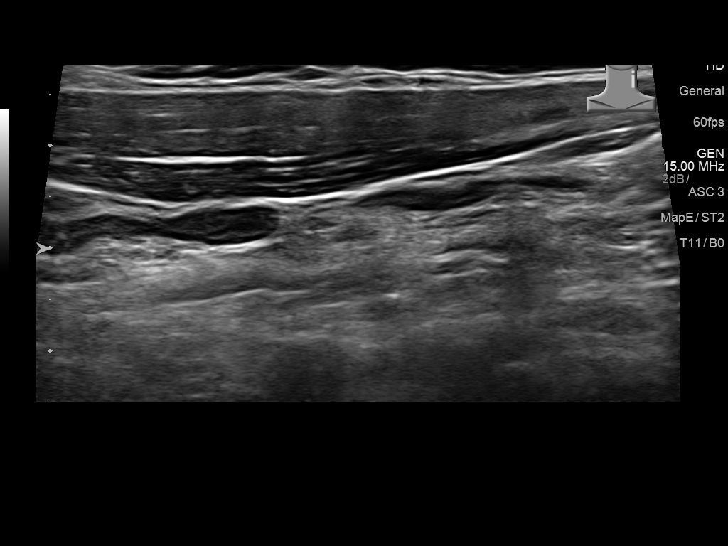

[12 of 25 positions shown; findings below may reference images not displayed]

FINDINGS: Parenchymal Echotexture: Mildly heterogenous

Isthmus: 0.3 cm

Right lobe: 5.9 cm x 2.2 cm x 1.3 cm

Left lobe: 5.5 cm x 1.6 cm x 1.3 cm

_________________________________________________________

Estimated total number of nodules >/= 1 cm: 1

Number of spongiform nodules >/=  2 cm not described below (TR1): 0

Number of mixed cystic and solid nodules >/= 1.5 cm not described
below (TR2): 0

Nodule # 1:

Location: Right; Inferior

Maximum size: 0.5 cm; Other 2 dimensions: 0.2 cm x 0.3 cm cm

Composition: cystic/almost completely cystic (0)

Echogenicity: anechoic (0)

Shape: not taller-than-wide (0)

Margins: smooth (0)

Echogenic foci: none (0)

ACR TI-RADS total points: 0.

ACR TI-RADS risk category: TR1 (0-1 points).

ACR TI-RADS recommendations:

Colloid nodule/cyst does not require surveillance or biopsy

Nodule # 1:

Location: Left; Superior

Maximum size: 1.1 cm; Other 2 dimensions: 0.5 cm x 0.7 cm

Composition: cystic/almost completely cystic (0)

Echogenicity: anechoic (0)

Shape: not taller-than-wide (0)

Margins: smooth (0)

Echogenic foci: none (0)

ACR TI-RADS total points: 0.

ACR TI-RADS risk category: TR1 (0-1 points).

ACR TI-RADS recommendations:

Colloid cyst/nodule does not require surveillance or biopsy

Nodule # 2:

Location: Left; Inferior

Maximum size: 0.7 cm; Other 2 dimensions: 0.4 cm x 0.5 cm

Composition: cystic/almost completely cystic (0)

Echogenicity: anechoic (0)

Shape: not taller-than-wide (0)

Margins: smooth (0)

Echogenic foci: none (0)

ACR TI-RADS total points: 0.

ACR TI-RADS risk category: TR1 (0-1 points).

ACR TI-RADS recommendations:

Colloid cyst/nodule does not require surveillance or biopsy

Nodule # 3:

Location: Left; Inferior

Maximum size: 0.4 cm; Other 2 dimensions: 0.2 cm x 0.4 cm

Composition: cystic/almost completely cystic (0)

Echogenicity: anechoic (0)

Shape: not taller-than-wide (0)

Margins: smooth (0)

Echogenic foci: none (0)

ACR TI-RADS total points: 0.

ACR TI-RADS risk category: TR1 (0-1 points).

ACR TI-RADS recommendations:

Colloid cyst/nodule does not require surveillance or biopsy

Nodule # 4:

Location: Left; Inferior

Maximum size: 0.5 cm; Other 2 dimensions: 0.2 cm x 0.3 cm

Composition: cystic/almost completely cystic (0)

Echogenicity: anechoic (0)

Shape: not taller-than-wide (0)

Margins: smooth (0)

Echogenic foci: none (0)

ACR TI-RADS total points: 0.

ACR TI-RADS risk category: TR1 (0-1 points).

ACR TI-RADS recommendations:

Colloid cyst/nodule does not require surveillance or biopsy
IMPRESSION: Multiple bilateral colloid cysts/nodules. None of these nodules meet
criteria for biopsy or surveillance, as designated by the newly
established ACR TI-RADS criteria.

Recommendations follow those established by the new ACR TI-RADS
criteria ([HOSPITAL] 8227;[DATE]).

## 2018-10-29 ENCOUNTER — Telehealth: Payer: Self-pay

## 2018-10-29 NOTE — Telephone Encounter (Signed)
Copied from CRM [id number]. Topic: General - Other >> Oct 29, 2018  1:18 PM Baldo Daub L wrote: Reason for CRM:   Pt has BCBS through [employer] and she was told that Dr. Judie Grieve isn't coming up under [employer].  Pt was told to call and speak with office administrator to see what they could do to help her continue to have coverage at the office. Pt can be reached at [phone]

## 2018-10-30 NOTE — Telephone Encounter (Signed)
Patient following up on this.

## 2018-11-18 ENCOUNTER — Ambulatory Visit (INDEPENDENT_AMBULATORY_CARE_PROVIDER_SITE_OTHER): Payer: BC Managed Care – PPO

## 2018-11-18 ENCOUNTER — Ambulatory Visit: Payer: BC Managed Care – PPO | Admitting: Internal Medicine

## 2018-11-18 ENCOUNTER — Ambulatory Visit: Payer: BC Managed Care – PPO

## 2018-11-18 ENCOUNTER — Encounter: Payer: Self-pay | Admitting: Internal Medicine

## 2018-11-18 VITALS — BP 132/86 | HR 75 | Temp 97.6°F | Ht 66.0 in | Wt 188.8 lb

## 2018-11-18 DIAGNOSIS — R112 Nausea with vomiting, unspecified: Secondary | ICD-10-CM

## 2018-11-18 DIAGNOSIS — E559 Vitamin D deficiency, unspecified: Secondary | ICD-10-CM

## 2018-11-18 DIAGNOSIS — G8929 Other chronic pain: Secondary | ICD-10-CM

## 2018-11-18 DIAGNOSIS — K529 Noninfective gastroenteritis and colitis, unspecified: Secondary | ICD-10-CM | POA: Diagnosis not present

## 2018-11-18 DIAGNOSIS — Z1389 Encounter for screening for other disorder: Secondary | ICD-10-CM

## 2018-11-18 DIAGNOSIS — M5442 Lumbago with sciatica, left side: Secondary | ICD-10-CM

## 2018-11-18 DIAGNOSIS — K649 Unspecified hemorrhoids: Secondary | ICD-10-CM

## 2018-11-18 DIAGNOSIS — Z0000 Encounter for general adult medical examination without abnormal findings: Secondary | ICD-10-CM

## 2018-11-18 DIAGNOSIS — E611 Iron deficiency: Secondary | ICD-10-CM

## 2018-11-18 DIAGNOSIS — F419 Anxiety disorder, unspecified: Secondary | ICD-10-CM

## 2018-11-18 DIAGNOSIS — M25562 Pain in left knee: Secondary | ICD-10-CM

## 2018-11-18 DIAGNOSIS — Z13220 Encounter for screening for lipoid disorders: Secondary | ICD-10-CM

## 2018-11-18 DIAGNOSIS — Z1329 Encounter for screening for other suspected endocrine disorder: Secondary | ICD-10-CM

## 2018-11-18 DIAGNOSIS — I8393 Asymptomatic varicose veins of bilateral lower extremities: Secondary | ICD-10-CM

## 2018-11-18 NOTE — Progress Notes (Signed)
Chief Complaint  Patient presents with  . Leg Pain  . Back Pain  . Acute Visit    Vomitting and diarrhea   F/u  1. N/v/diarrhea last last night though feels nauseated today. Her daughter was also sick and had chicken salad from [place]. She is c/w food poisoning 2. C/o low back pain cyclical worse with menses at times left and goes down left leg  3. Legs with varicose veins and wants to know what to do and left knee feels tight area knee area 4. Vitamin D def wants vitamin D level repeated  5. Pressure around anus and wants to know what could be the reason for this.  6. Anxiety better since tx'ed low vitamin D per pt and needs Rx refill of Xanax prn uses sparingly   Review of Systems  Constitutional: Negative for weight loss.  HENT: Negative for hearing loss.   Eyes: Negative for blurred vision.  Respiratory: Negative for shortness of breath.   Cardiovascular: Negative for chest pain.  Gastrointestinal: Positive for diarrhea, nausea and vomiting. Negative for abdominal pain.  Genitourinary:       Anal pressure    Musculoskeletal: Positive for back pain.  Skin: Negative for rash.  Neurological: Negative for headaches.  Psychiatric/Behavioral: The patient is not nervous/anxious.    Past Medical History:  Diagnosis Date  . Anxiety   . Breast pain    b/l with menses   . GERD (gastroesophageal reflux disease)   . Hematuria    per pt neg work up  . Low back pain    rad to left leg   No past surgical history on file. Family History  Problem Relation Age of Onset  . Hypertension Father   . Breast cancer Neg Hx    Social History   Socioeconomic History  . Marital status: Married    Spouse name: Not on file  . Number of children: Not on file  . Years of education: Not on file  . Highest education level: Not on file  Occupational History  . Not on file  Social Needs  . Financial resource strain: Not on file  . Food insecurity:    Worry: Not on file    Inability: Not on  file  . Transportation needs:    Medical: Not on file    Non-medical: Not on file  Tobacco Use  . Smoking status: Never Smoker  . Smokeless tobacco: Never Used  Substance and Sexual Activity  . Alcohol use: Yes    Comment: Very rare  . Drug use: No  . Sexual activity: Yes  Lifestyle  . Physical activity:    Days per week: Not on file    Minutes per session: Not on file  . Stress: Not on file  Relationships  . Social connections:    Talks on phone: Not on file    Gets together: Not on file    Attends religious service: Not on file    Active member of club or organization: Not on file    Attends meetings of clubs or organizations: Not on file    Relationship status: Not on file  . Intimate partner violence:    Fear of current or ex partner: Not on file    Emotionally abused: Not on file    Physically abused: Not on file    Forced sexual activity: Not on file  Other Topics Concern  . Not on file  Social History Narrative   Graduated with Bachelor's degree  in public health 6812 from [employer].   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 ALPRAZolam (XANAX) 0.5 MG tablet TAKE ONE TABLET BY MOUTH AT BEDTIME AS NEEDED FOR ANXIETY  . cetirizine (ZYRTEC) 10 MG tablet Take 10 mg by mouth as needed for allergies. Reported on 02/14/2016  . Cholecalciferol 50000 units capsule Take 1 capsule (50,000 Units total) by mouth once a week.  . fluticasone (FLONASE) 50 MCG/ACT nasal spray USE TWO SPRAYS IN EACH NOSTRIL ONCE DAILY  . ibuprofen (ADVIL,MOTRIN) 200 MG tablet Take 400 mg by mouth every 6 (six) hours as needed for moderate pain.  . pantoprazole (PROTONIX) 40 MG tablet    No Known Allergies No results found for this or any previous visit (from the past 2160 hour(s)). Objective  Body mass index is 30.47 kg/m. Wt Readings from Last 3 Encounters:  11/18/18 188 lb 12.8 oz (85.6 kg)  05/26/18 193 lb 3.2  oz (87.6 kg)  11/15/16 181 lb (82.1 kg)   Temp Readings from Last 3 Encounters:  11/18/18 97.6 F (36.4 C) (Oral)  05/26/18 98.4 F (36.9 C) (Oral)  11/15/16 98.3 F (36.8 C) (Oral)   BP Readings from Last 3 Encounters:  11/18/18 132/86  05/26/18 116/78  11/15/16 108/66   Pulse Readings from Last 3 Encounters:  11/18/18 75  05/26/18 79  11/15/16 (!) 101    Physical Exam Vitals signs and nursing note reviewed.  Constitutional:      Appearance: Normal appearance. She is well-developed. She is obese.  HENT:     Head: Normocephalic and atraumatic.     Nose: Nose normal.     Mouth/Throat:     Mouth: Mucous membranes are moist.     Pharynx: Oropharynx is clear.  Eyes:     Conjunctiva/sclera: Conjunctivae normal.     Pupils: Pupils are equal, round, and reactive to light.  Cardiovascular:     Rate and Rhythm: Normal rate and regular rhythm.     Heart sounds: Normal heart sounds.  Pulmonary:     Effort: Pulmonary effort is normal.     Breath sounds: Normal breath sounds.  Genitourinary:    Rectum: External hemorrhoid present.     Comments: Ext hemorrhoids about 6 oclock  Skin:    General: Skin is warm and dry.  Neurological:     General: No focal deficit present.     Mental Status: She is alert and oriented to person, place, and time. Mental status is at baseline.     Gait: Gait normal.  Psychiatric:        Attention and Perception: Attention and perception normal.        Mood and Affect: Mood and affect normal.        Speech: Speech normal.        Behavior: Behavior normal. Behavior is cooperative.        Thought Content: Thought content normal.        Cognition and Memory: Cognition and memory normal.        Judgment: Judgment normal.     Assessment   1. Likely gastroenteritis from food as daughter with similar sx's 2. Low back pain ddx MSK vs endometriosis since cyclical around cycle  Left knee pain 3. Varicose veins 4. Vit D def now normal  5.  Hemorrhoids ext  6. Anxiety improved  7. HM Plan  1. otc peptobismol/bland diet  2.  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 Lumbar lordosis is preserved and stable. Disc spaces are stable and within normal limits. There is up to moderate lumbar facet degeneration at the left L4-L5 and L5-S1 levels. Sacral ala and SI joints are within normal limits. Visible lower thoracic levels appear intact. No acute osseous abnormality identified. Negative visible abdominal and pelvic visceral contours.  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    left knee today neg left knee Xray   Consider PT, MRI in future and ortho for L knee/ortho spine/pm&R for back  RA labs neg 05/13/17  3. Rx compression stockings 15-20 mmhg knee highs  4. Check vitamin D now nl rec D3 5000 IU daily  5. Increase fiber sitz baths, miralax prn reduced constipation increase water  6. Prn xanax  7.  Flu check to see if had this year, Tdap utd  MMR immune  Hep B immune 43   [facility] 7[facility]96 h/o cyclical breast pain with menses  Pap had 05/27/16 neg pap neg HPV h/o abnormal pap remotely will need pap again 04/2019 HIV/HCV neg in past  rec healthy diet choices and exercise to lose  Never smoker  Mild anemia check iron levels today   Fasting labs due 05/27/2019 orders placed   Provider: Dr. Olivia Mackie McLean-Scocuzza-[facility]

## 2018-11-18 NOTE — Patient Instructions (Addendum)
Bananas, applesauce, toast, crackers, broth, rice  gingerale  pedialyte Low cal gatorade  peptobismol   Prune juice Magnesium 400-500 mg daily  miralax  Fiber gummies/benefiber/metamucil for constipation    Varicose Veins Varicose veins are veins that have become enlarged, bulged, and twisted. They most often appear in the legs. What are the causes? This condition is caused by damage to the valves in the vein. These valves help blood return to your heart. When they are damaged and they stop working properly, blood may flow backward and back up in the veins near the skin, causing the veins to get larger and appear twisted. The condition can result from any issue that causes blood to back up, like pregnancy, prolonged standing, or obesity. What increases the risk? This condition is more likely to develop in people who are:  On their feet a lot.  Pregnant.  Overweight. What are the signs or symptoms? Symptoms of this condition include:  Bulging, twisted, and bluish veins.  A feeling of heaviness. This may be worse at the end of the day.  Leg pain. This may be worse at the end of the day.  Swelling in the leg.  Changes in skin color over the veins. How is this diagnosed? This condition may be diagnosed based on your symptoms, a physical exam, and an ultrasound test. How is this treated? Treatment for this condition may involve:  Avoiding sitting or standing in one position for long periods of time.  Wearing compression stockings. These stockings help to prevent blood clots and reduce swelling in the legs.  Raising (elevating) the legs when resting.  Losing weight.  Exercising regularly. If you have persistent symptoms or want to improve the way your varicose veins look, you may choose to have a procedure to close the varicose veins off or to remove them. Treatments to close off the veins include:  Sclerotherapy. In this treatment, a solution is injected into a vein  to close it off.  Laser treatment. In this treatment, the vein is heated with a laser to close it off.  Radiofrequency vein ablation. In this treatment, an electrical current produced by radio waves is used to close off the vein. Treatments to remove the veins include:  Phlebectomy. In this treatment, the veins are removed through small incisions made over the veins.  Vein ligation and stripping. In this treatment, incisions are made over the veins. The veins are then removed after being tied (ligated) with stitches (sutures). Follow these instructions at home: Activity  Walk as much as possible. Walking increases blood flow. This helps blood return to the heart and takes pressure off your veins. It also increases your cardiovascular strength.  Follow your health care provider's instructions about exercising.  Do not stand or sit in one position for a long period of time.  Do not sit with your legs crossed.  Rest with your legs raised during the day. General instructions   Follow any diet instructions given to you by your health care provider.  Wear compression stockings as directed by your health care provider. Do not wear other kinds of tight clothing around your legs, pelvis, or waist.  Elevate your legs at night to above the level of your heart.  If you get a cut in the skin over the varicose vein and the vein bleeds: ? Lie down with your leg raised. ? Apply firm pressure to the cut with a clean cloth until the bleeding stops. ? Place a bandage (dressing) on  the cut. Contact a health care provider if:  The skin around your varicose veins starts to break down.  You have pain, redness, tenderness, or hard swelling over a vein.  You are uncomfortable because of pain.  You get a cut in the skin over a varicose vein and it will not stop bleeding. Summary  Varicose veins are veins that have become enlarged, bulged, and twisted. They most often appear in the legs.  This  condition is caused by damage to the valves in the vein. These valves help blood return to your heart.  Treatment for this condition includes frequent movements, wearing compression stockings, losing weight, and exercising regularly. In some cases, procedures are done to close off or remove the veins.  Treatment for this condition may include wearing compression stockings, elevating the legs, losing weight, and engaging in regular activity. In some cases, procedures are done to close off or remove the veins. This information is not intended to replace advice given to you by your health care provider. Make sure you discuss any questions you have with your health care provider. Document Released: 07/24/2005 Document Revised: 11/06/2016 Document Reviewed: 11/06/2016 Elsevier Interactive Patient Education  2019 [employer] Inc[employer]   Hemorrhoids Hemorrhoids are swollen veins in and around the rectum or anus. There are two types of hemorrhoids:  Internal hemorrhoids. These occur in the veins that are just inside the rectum. They may poke through to the outside and become irritated and painful.  External hemorrhoids. These occur in the veins that are outside the anus and can be felt as a painful swelling or hard lump near the anus. Most hemorrhoids do not cause serious problems, and they can be managed with home treatments such as diet and lifestyle changes. If home treatments do not help the symptoms, procedures can be done to shrink or remove the hemorrhoids. What are the causes? This condition is caused by increased pressure in the anal area. This pressure may result from various things, including:  Constipation.  Straining to have a bowel movement.  Diarrhea.  Pregnancy.  Obesity.  Sitting for long periods of time.  Heavy lifting or other activity that causes you to strain.  Anal sex.  Riding a bike for a long period of time. What are the signs or symptoms? Symptoms of this condition  include:  Pain.  Anal itching or irritation.  Rectal bleeding.  Leakage of stool (feces).  Anal swelling.  One or more lumps around the anus. How is this diagnosed? This condition can often be diagnosed through a visual exam. Other exams or tests may also be done, such as:  An exam that involves feeling the rectal area with a gloved hand (digital rectal exam).  An exam of the anal canal that is done using a small tube (anoscope).  A blood test, if you have lost a significant amount of blood.  A test to look inside the colon using a flexible tube with a camera on the end (sigmoidoscopy or colonoscopy). How is this treated? This condition can usually be treated at home. However, various procedures may be done if dietary changes, lifestyle changes, and other home treatments do not help your symptoms. These procedures can help make the hemorrhoids smaller or remove them completely. Some of these procedures involve surgery, and others do not. Common procedures include:  Rubber band ligation. Rubber bands are placed at the base of the hemorrhoids to cut off their blood supply.  Sclerotherapy. Medicine is injected into the hemorrhoids to  shrink them.  Infrared coagulation. A type of light energy is used to get rid of the hemorrhoids.  Hemorrhoidectomy surgery. The hemorrhoids are surgically removed, and the veins that supply them are tied off.  Stapled hemorrhoidopexy surgery. The surgeon staples the base of the hemorrhoid to the rectal wall. Follow these instructions at home: Eating and drinking   Eat foods that have a lot of fiber in them, such as whole grains, beans, nuts, fruits, and vegetables.  Ask your health care provider about taking products that have added fiber (fiber supplements).  Reduce the amount of fat in your diet. You can do this by eating low-fat dairy products, eating less red meat, and avoiding processed foods.  Drink enough fluid to keep your urine pale  yellow. Managing pain and swelling   Take warm sitz baths for 20 minutes, 3-4 times a day to ease pain and discomfort. You may do this in a bathtub or using a portable sitz bath that fits over the toilet.  If directed, apply ice to the affected area. Using ice packs between sitz baths may be helpful. ? Put ice in a plastic bag. ? Place a towel between your skin and the bag. ? Leave the ice on for 20 minutes, 2-3 times a day. General instructions  Take over-the-counter and prescription medicines only as told by your health care provider.  Use medicated creams or suppositories as told.  Get regular exercise. Ask your health care provider how much and what kind of exercise is best for you. In general, you should do moderate exercise for at least 30 minutes on most days of the week (150 minutes each week). This can include activities such as walking, biking, or yoga.  Go to the bathroom when you have the urge to have a bowel movement. Do not wait.  Avoid straining to have bowel movements.  Keep the anal area dry and clean. Use wet toilet paper or moist towelettes after a bowel movement.  Do not sit on the toilet for long periods of time. This increases blood pooling and pain.  Keep all follow-up visits as told by your health care provider. This is important. Contact a health care provider if you have:  Increasing pain and swelling that are not controlled by treatment or medicine.  Difficulty having a bowel movement, or you are unable to have a bowel movement.  Pain or inflammation outside the area of the hemorrhoids. Get help right away if you have:  Uncontrolled bleeding from your rectum. Summary  Hemorrhoids are swollen veins in and around the rectum or anus.  Most hemorrhoids can be managed with home treatments such as diet and lifestyle changes.  Taking warm sitz baths can help ease pain and discomfort.  In severe cases, procedures or surgery can be done to shrink or  remove the hemorrhoids. This information is not intended to replace advice given to you by your health care provider. Make sure you discuss any questions you have with your health care provider. Document Released: 10/11/2000 Document Revised: 03/05/2018 Document Reviewed: 03/05/2018 Elsevier Interactive Patient Education  2019 [employer] Inc.  Chronic Back Pain When back pain lasts longer than 3 months, it is called chronic back pain.The cause of your back pain may not be known. Some common causes include:  Wear and tear (degenerative disease) of the bones, ligaments, or disks in your back.  Inflammation and stiffness in your back (arthritis). People who have chronic back pain often go through certain periods in which  the pain is more intense (flare-ups). Many people can learn to manage the pain with home care. Follow these instructions at home: Pay attention to any changes in your symptoms. Take these actions to help with your pain: Activity   Avoid bending and other activities that make the problem worse.  Maintain a proper position when standing or sitting: ? When standing, keep your upper back and neck straight, with your shoulders pulled back. Avoid slouching. ? When sitting, keep your back straight and relax your shoulders. Do not round your shoulders or pull them backward.  Do not sit or stand in one place for long periods of time.  Take brief periods of rest throughout the day. This will reduce your pain. Resting in a lying or standing position is usually better than sitting to rest.  When you are resting for longer periods, mix in some mild activity or stretching between periods of rest. This will help to prevent stiffness and pain.  Get regular exercise. Ask your health care provider what activities are safe for you.  Do not lift anything that is heavier than 10 lb (4.5 kg). Always use proper lifting technique, which includes: ? Bending your knees. ? Keeping the load close  to your body. ? Avoiding twisting.  Sleep on a firm mattress in a comfortable position. Try lying on your side with your knees slightly bent. If you lie on your back, put a pillow under your knees. Managing pain  If directed, apply ice to the painful area. Your health care provider may recommend applying ice during the first 24-48 hours after a flare-up begins. ? Put ice in a plastic bag. ? Place a towel between your skin and the bag. ? Leave the ice on for 20 minutes, 2-3 times per day.  If directed, apply heat to the affected area as often as told by your health care provider. Use the heat source that your health care provider recommends, such as a moist heat pack or a heating pad. ? Place a towel between your skin and the heat source. ? Leave the heat on for 20-30 minutes. ? Remove the heat if your skin turns bright red. This is especially important if you are unable to feel pain, heat, or cold. You may have a greater risk of getting burned.  Try soaking in a warm tub.  Take over-the-counter and prescription medicines only as told by your health care provider.  Keep all follow-up visits as told by your health care provider. This is important. Contact a health care provider if:  You have pain that is not relieved with rest or medicine. Get help right away if:  You have weakness or numbness in one or both of your legs or feet.  You have trouble controlling your bladder or your bowels.  You have nausea or vomiting.  You have pain in your abdomen.  You have shortness of breath or you faint. This information is not intended to replace advice given to you by your health care provider. Make sure you discuss any questions you have with your health care provider. Document Released: 11/21/2004 Document Revised: 05/21/2018 Document Reviewed: 04/23/2017 Elsevier Interactive Patient Education  2019 [employer]   Parke SimmersBland Diet A bland diet consists of foods that are often soft and do  not have a lot of fat, fiber, or extra seasonings. Foods without fat, fiber, or seasoning are easier for the body to digest. They are also less likely to irritate your mouth, throat, stomach, and  other parts of your digestive system. A bland diet is sometimes called a BRAT diet. What is my plan? Your health care provider or food and nutrition specialist (dietitian) may recommend specific changes to your diet to prevent symptoms or to treat your symptoms. These changes may include:  Eating small meals often.  Cooking food until it is soft enough to chew easily.  Chewing your food well.  Drinking fluids slowly.  Not eating foods that are very spicy, sour, or fatty.  Not eating citrus fruits, such as oranges and grapefruit. What do I need to know about this diet?  Eat a variety of foods from the bland diet food list.  Do not follow a bland diet longer than needed.  Ask your health care provider whether you should take vitamins or supplements. What foods can I eat? Grains  Hot cereals, such as cream of wheat. Rice. Bread, crackers, or tortillas made from refined white flour. Vegetables Canned or cooked vegetables. Mashed or boiled potatoes. Fruits  Bananas. Applesauce. Other types of cooked or canned fruit with the skin and seeds removed, such as canned peaches or pears. Meats and other proteins  Scrambled eggs. Creamy peanut butter or other nut butters. Lean, well-cooked meats, such as chicken or fish. Tofu. Soups or broths. Dairy Low-fat dairy products, such as milk, cottage cheese, or yogurt. Beverages  Water. Herbal tea. Apple juice. Fats and oils Mild salad dressings. Canola or olive oil. Sweets and desserts Pudding. Custard. Fruit gelatin. Ice cream. The items listed above may not be a complete list of recommended foods and beverages. Contact a dietitian for more options. What foods are not recommended? Grains Whole grain breads and cereals. Vegetables Raw  vegetables. Fruits Raw fruits, especially citrus, berries, or dried fruits. Dairy Whole fat dairy foods. Beverages Caffeinated drinks. Alcohol. Seasonings and condiments Strongly flavored seasonings or condiments. Hot sauce. Salsa. Other foods Spicy foods. Fried foods. Sour foods, such as pickled or fermented foods. Foods with high sugar content. Foods high in fiber. The items listed above may not be a complete list of foods and beverages to avoid. Contact a dietitian for more information. Summary  A bland diet consists of foods that are often soft and do not have a lot of fat, fiber, or extra seasonings.  Foods without fat, fiber, or seasoning are easier for the body to digest.  Check with your health care provider to see how long you should follow this diet plan. It is not meant to be followed for long periods. This information is not intended to replace advice given to you by your health care provider. Make sure you discuss any questions you have with your health care provider. Document Released: 02/05/2016 Document Revised: 11/12/2017 Document Reviewed: 11/12/2017 Elsevier Interactive Patient Education  2019 [employer]

## 2018-11-18 NOTE — Progress Notes (Signed)
Pre visit review using our clinic review tool, if applicable. No additional management support is needed unless otherwise documented below in the visit note. 

## 2018-11-19 ENCOUNTER — Telehealth: Payer: Self-pay | Admitting: Internal Medicine

## 2018-11-19 ENCOUNTER — Other Ambulatory Visit: Payer: Self-pay | Admitting: Internal Medicine

## 2018-11-19 DIAGNOSIS — F419 Anxiety disorder, unspecified: Secondary | ICD-10-CM

## 2018-11-19 LAB — IRON,TIBC AND FERRITIN PANEL
%SAT: 38 % (calc) (ref 16–45)
Ferritin: 80 ng/mL (ref 16–232)
IRON: 121 ug/dL (ref 40–190)
TIBC: 317 mcg/dL (calc) (ref 250–450)

## 2018-11-19 LAB — VITAMIN D 25 HYDROXY (VIT D DEFICIENCY, FRACTURES): VITD: 71.81 ng/mL (ref 30.00–100.00)

## 2018-11-19 MED ORDER — ALPRAZOLAM 0.5 MG PO TABS: ORAL_TABLET | ORAL | 2 refills | Status: DC

## 2018-11-19 NOTE — Telephone Encounter (Signed)
Pt given results per Dr Judie Grieve; see result notes dated 11/19/2018.

## 2018-11-19 NOTE — Telephone Encounter (Signed)
Copied from CRM [id number]. Topic: Quick Communication - Lab Results (Clinic Use ONLY) >> Nov 19, 2018  1:32 PM Bronwen Betters, CMA wrote: Called patient to inform them of [id number] lab results. When patient returns call, triage nurse may disclose results.   Pt returning call for results

## 2018-11-23 ENCOUNTER — Encounter: Payer: Self-pay | Admitting: Internal Medicine

## 2018-11-23 DIAGNOSIS — I8393 Asymptomatic varicose veins of bilateral lower extremities: Secondary | ICD-10-CM | POA: Insufficient documentation

## 2018-11-23 DIAGNOSIS — K649 Unspecified hemorrhoids: Secondary | ICD-10-CM | POA: Insufficient documentation

## 2018-11-23 NOTE — Telephone Encounter (Signed)
I saw the patient in the office she will e-mail Dr .Shirlee LatchMclean with the phone number to call.

## 2018-11-23 NOTE — Progress Notes (Signed)
[facility] did not have a record of immunization,  Patient does not recall when she had vaccine.

## 2019-02-23 ENCOUNTER — Ambulatory Visit (INDEPENDENT_AMBULATORY_CARE_PROVIDER_SITE_OTHER): Payer: BC Managed Care – PPO | Admitting: Internal Medicine

## 2019-02-23 DIAGNOSIS — M545 Low back pain, unspecified: Secondary | ICD-10-CM

## 2019-02-23 DIAGNOSIS — Z1329 Encounter for screening for other suspected endocrine disorder: Secondary | ICD-10-CM

## 2019-02-23 DIAGNOSIS — Z1389 Encounter for screening for other disorder: Secondary | ICD-10-CM

## 2019-02-23 DIAGNOSIS — F419 Anxiety disorder, unspecified: Secondary | ICD-10-CM

## 2019-02-23 DIAGNOSIS — K649 Unspecified hemorrhoids: Secondary | ICD-10-CM

## 2019-02-23 DIAGNOSIS — Z13220 Encounter for screening for lipoid disorders: Secondary | ICD-10-CM

## 2019-02-23 DIAGNOSIS — Z1231 Encounter for screening mammogram for malignant neoplasm of breast: Secondary | ICD-10-CM | POA: Diagnosis not present

## 2019-02-23 DIAGNOSIS — Z0000 Encounter for general adult medical examination without abnormal findings: Secondary | ICD-10-CM

## 2019-02-23 MED ORDER — LIDOCAINE-HYDROCORT (PERIANAL) 3-0.5 % EX CREA: 1.0000 "application " | TOPICAL_CREAM | Freq: Two times a day (BID) | CUTANEOUS | 0 refills | Status: DC | PRN

## 2019-02-26 ENCOUNTER — Encounter: Payer: Self-pay | Admitting: Internal Medicine

## 2019-02-26 ENCOUNTER — Telehealth: Payer: Self-pay | Admitting: Internal Medicine

## 2019-02-26 NOTE — Telephone Encounter (Signed)
Call pt to see if rectal cream was able to get?   TMS

## 2019-02-26 NOTE — Telephone Encounter (Signed)
Patient is going to call pharmacy at 9 when they open.  She will return call back. She has not gotten medication

## 2019-02-26 NOTE — Progress Notes (Signed)
Virtual Visit via Video Note  I connected with@  on 02/23/19 at  3:44 PM EDT by a video enabled telemedicine application and verified that I am speaking with the correct person using two identifiers.  Location patient: home Location provider:work Persons participating in the virtual visit: patient, provider  I discussed the limitations of evaluation and management by telemedicine and the availability of in person appointments. The patient expressed understanding and agreed to proceed.   HPI: 1. Hemorrhoids when constipated had burning pressure tried a stool softner with some relief wants to do sitz baths  2. Low back pain and left knee pain improved she has been walking at home w/o pain for now and Xray low back arthritis and scoliosis changes and improving her posture has helped no pain down leg  3. Vitamin D improved 71.81  4. Anxiety controlled for now and less anxiety with driving   ROS: See pertinent positives and negatives per HPI.  Past Medical History:  Diagnosis Date  . Anxiety   . Breast pain    b/l with menses   . GERD (gastroesophageal reflux disease)   . Hematuria    per pt neg work up  . Low back pain    rad to left leg  . Varicose veins of legs   . Vitamin D deficiency     No past surgical history on file.  Family History  Problem Relation Age of Onset  . Hypertension Father   . Peripheral Artery Disease Father   . Breast cancer Neg Hx     SOCIAL HX: married 1 daughter    Current Outpatient Medications:  .  ALPRAZolam (XANAX) 0.5 MG tablet, TAKE ONE TABLET BY MOUTH AT BEDTIME AS NEEDED FOR ANXIETY, Disp: 30 tablet, Rfl: 2 .  cetirizine (ZYRTEC) 10 MG tablet, Take 10 mg by mouth as needed for allergies. Reported on 02/14/2016, Disp: , Rfl:  .  Cholecalciferol 50000 units capsule, Take 1 capsule (50,000 Units total) by mouth once a week., Disp: 13 capsule, Rfl: 1 .  fluticasone (FLONASE) 50 MCG/ACT nasal spray, USE TWO SPRAYS IN EACH NOSTRIL ONCE DAILY,  Disp: 16 g, Rfl: 6 .  ibuprofen (ADVIL,MOTRIN) 200 MG tablet, Take 400 mg by mouth every 6 (six) hours as needed for moderate pain., Disp: , Rfl:  .  Lidocaine-Hydrocortisone Ace (LIDOCAINE-HYDROCORT, PERIANAL,) 3-0.5 % CREA, Apply 1 application topically 2 (two) times daily as needed., Disp: 30 g, Rfl: 0 .  pantoprazole (PROTONIX) 40 MG tablet, , Disp: , Rfl:   EXAM:  VITALS per patient if applicable:  GENERAL: alert, oriented, appears well and in no acute distress  HEENT: atraumatic, conjunttiva clear, no obvious abnormalities on inspection of external nose and ears  NECK: normal movements of the head and neck  LUNGS: on inspection no signs of respiratory distress, breathing rate appears normal, no obvious gross SOB, gasping or wheezing  CV: no obvious cyanosis  MS: moves all visible extremities without noticeable abnormality  PSYCH/NEURO: pleasant and cooperative, no obvious depression or anxiety, speech and thought processing grossly intact  ASSESSMENT AND PLAN:  Discussed the following assessment and plan:  Hemorrhoids, unspecified hemorrhoid type - Plan: Lidocaine-Hydrocortisone Ace (LIDOCAINE-HYDROCORT, PERIANAL,) 3-0.5 % CREA Disc perineal basin or bath soaks  If not better refer GI Dr. Marius Ditch or surgery   Anxiety controlled for now   Low back pain and left knee pain improved for now  HM Flu check to see if had this year, Tdap utd  MMR immune  Hep  B immune  rec D3 5000 IU daily otc   [facility] 04[facility] h/o cyclical breast pain with menses  -ordered for future  Pap had 05/27/16 neg pap neg HPV h/o abnormal pap remotely will need pap again 04/2019 -pt wants to wait will do at f/u with physical   HIV/HCV neg in past  rec healthy diet choices and exercise to lose  Never smoker   Fasting labs due 05/27/2019 orders placed pt wants to wait and wait for physical    I discussed the assessment and treatment plan with the patient. The patient was provided an  opportunity to ask questions and all were answered. The patient agreed with the plan and demonstrated an understanding of the instructions.   The patient was advised to call back or seek an in-person evaluation if the symptoms worsen or if the condition fails to improve as anticipated.  Time spent 15 minutes  Delorise Jackson, MD

## 2019-08-01 ENCOUNTER — Emergency Department
Admission: EM | Admit: 2019-08-01 | Discharge: 2019-08-01 | Disposition: A | Discharge status: Home / Self Care | Payer: BC Managed Care – PPO | Attending: Emergency Medicine | Admitting: Emergency Medicine

## 2019-08-01 ENCOUNTER — Emergency Department: Payer: BC Managed Care – PPO

## 2019-08-01 ENCOUNTER — Other Ambulatory Visit: Payer: Self-pay

## 2019-08-01 DIAGNOSIS — Y999 Unspecified external cause status: Secondary | ICD-10-CM | POA: Diagnosis not present

## 2019-08-01 DIAGNOSIS — S6991XA Unspecified injury of right wrist, hand and finger(s), initial encounter: Secondary | ICD-10-CM | POA: Diagnosis present

## 2019-08-01 DIAGNOSIS — Z79899 Other long term (current) drug therapy: Secondary | ICD-10-CM | POA: Insufficient documentation

## 2019-08-01 DIAGNOSIS — Y929 Unspecified place or not applicable: Secondary | ICD-10-CM | POA: Insufficient documentation

## 2019-08-01 DIAGNOSIS — S52501A Unspecified fracture of the lower end of right radius, initial encounter for closed fracture: Secondary | ICD-10-CM | POA: Diagnosis not present

## 2019-08-01 DIAGNOSIS — Y9351 Activity, roller skating (inline) and skateboarding: Secondary | ICD-10-CM | POA: Diagnosis not present

## 2019-08-01 DIAGNOSIS — W19XXXA Unspecified fall, initial encounter: Secondary | ICD-10-CM

## 2019-08-01 MED ORDER — ONDANSETRON 4 MG PO TBDP
4.0000 mg | ORAL_TABLET | Freq: Once | ORAL | Status: AC
  Administered 2019-08-01: 4 mg via ORAL
  Filled 2019-08-01: qty 1

## 2019-08-01 MED ORDER — MORPHINE SULFATE (PF) 4 MG/ML IV SOLN
4.0000 mg | Freq: Once | INTRAVENOUS | Status: AC
  Administered 2019-08-01: 4 mg via INTRAMUSCULAR
  Filled 2019-08-01: qty 1

## 2019-08-01 MED ORDER — HYDROCODONE-ACETAMINOPHEN 5-325 MG PO TABS
1.0000 | ORAL_TABLET | Freq: Once | ORAL | Status: AC
  Administered 2019-08-01: 1 via ORAL
  Filled 2019-08-01: qty 1

## 2019-08-01 MED ORDER — HYDROCODONE-ACETAMINOPHEN 5-325 MG PO TABS: 1.0000 | ORAL_TABLET | Freq: Four times a day (QID) | ORAL | 0 refills | Status: DC | PRN

## 2019-08-01 NOTE — ED Notes (Signed)
Pt states she fell on wrist falling off skateboard. Right wrist swollen and tender.

## 2019-08-01 NOTE — ED Provider Notes (Signed)
[facility] Emergency Department Provider Note  ____________________________________________   First MD Initiated Contact with Patient 08/01/19 1357     (approximate)  I have reviewed the triage vital signs and the nursing notes.   HISTORY  Chief Complaint Wrist Injury    HPI Victoria Gilmore is a 43 y.o. female presents emergency department complaining of right wrist pain.  Patient states she was on a skateboard and fell off.  No other injuries are reported.  She states I know about my wrist.    Past Medical History:  Diagnosis Date  . Anxiety   . Breast pain    b/l with menses   . GERD (gastroesophageal reflux disease)   . Hematuria    per pt neg work up  . Low back pain    rad to left leg  . Varicose veins of legs   . Vitamin D deficiency     Patient Active Problem List   Diagnosis Date Noted  . Varicose veins of both lower extremities 11/23/2018  . Hemorrhoids 11/23/2018  . Anxiety 05/26/2018  . Chronic left lumbar radiculopathy 05/26/2018  . GERD (gastroesophageal reflux disease) 05/26/2018  . Obesity (BMI 30-39.9) 05/26/2018  . Vitamin D deficiency 05/26/2018  . Back pain 11/17/2016  . Pain and swelling of right knee 05/27/2016  . Constipation 02/17/2016  . Rectocele, female 02/14/2016  . Panic disorder 05/27/2015  . Benign paroxysmal positional vertigo 05/27/2015  . History of reactive hypoglycemia 05/08/2013  . Annual physical exam 04/10/2011    History reviewed. No pertinent surgical history.  Prior to Admission medications   Medication Sig Start Date End Date Taking? Authorizing Provider  ALPRAZolam Prudy Feeler) 0.5 MG tablet TAKE ONE TABLET BY MOUTH AT BEDTIME AS NEEDED FOR ANXIETY 11/19/18   McLean-Scocuzza, Pasty Spillers, MD  cetirizine (ZYRTEC) 10 MG tablet Take 10 mg by mouth as needed for allergies. Reported on 02/14/2016    [provider]  Cholecalciferol 50000 units capsule Take 1 capsule (50,000 Units total) by  mouth once a week. 05/26/18   McLean-Scocuzza, Pasty Spillers, MD  fluticasone (FLONASE) 50 MCG/ACT nasal spray USE TWO SPRAYS IN EACH NOSTRIL ONCE DAILY 07/22/16   Sherlene Shams, MD  HYDROcodone-acetaminophen (NORCO/VICODIN) 5-325 MG tablet Take 1 tablet by mouth every 6 (six) hours as needed for moderate pain. 08/01/19   Media Pizzini, Roselyn Bering, PA-C  ibuprofen (ADVIL,MOTRIN) 200 MG tablet Take 400 mg by mouth every 6 (six) hours as needed for moderate pain.    [provider]  Lidocaine-Hydrocortisone Ace (LIDOCAINE-HYDROCORT, PERIANAL,) 3-0.5 % CREA Apply 1 application topically 2 (two) times daily as needed. 02/23/19   McLean-Scocuzza, Pasty Spillers, MD  pantoprazole (PROTONIX) 40 MG tablet  10/05/16   [provider]    Allergies Patient has no known allergies.  Family History  Problem Relation Age of Onset  . Hypertension Father   . Peripheral Artery Disease Father   . Breast cancer Neg Hx     Social History Social History   Tobacco Use  . Smoking status: Never Smoker  . Smokeless tobacco: Never Used  Substance Use Topics  . Alcohol use: Yes    Comment: Very rare  . Drug use: No    Review of Systems  Constitutional: No fever/chills Eyes: No visual changes. ENT: No sore throat. Respiratory: Denies cough Genitourinary: Negative for dysuria. Musculoskeletal: Negative for back pain.  Positive for right wrist pain Skin: Negative for rash.    ____________________________________________   PHYSICAL EXAM:  VITAL SIGNS: ED Triage Vitals  Enc Vitals Group     BP 08/01/19 1400 115/70     Pulse Rate 08/01/19 1400 77     Resp 08/01/19 1400 16     Temp 08/01/19 1400 97.7 F (36.5 C)     Temp Source 08/01/19 1400 Oral     SpO2 08/01/19 1400 99 %     Weight 08/01/19 1348 180 lb (81.6 kg)     Height 08/01/19 1348 5\' 6"  (1.676 m)     Head Circumference --      Peak Flow --      Pain Score 08/01/19 1348 10     Pain Loc --      Pain Edu? --      Excl. in [place]? --      Constitutional: Alert and oriented. Well appearing and in no acute distress. Eyes: Conjunctivae are normal.  Head: Atraumatic. Nose: No congestion/rhinnorhea. Mouth/Throat: Mucous membranes are moist.   Neck:  supple no lymphadenopathy noted Cardiovascular: Normal rate, regular rhythm. Respiratory: Normal respiratory effort.  No retractions,  GU: deferred Musculoskeletal: Decreased range of motion of the right wrist, positive deformity noted, neurovascular is intact, jewelry was removed by myself and placed in patient belonging bags given to the patient.   Neurologic:  Normal speech and language.  Skin:  Skin is warm, dry and intact. No rash noted. Psychiatric: Mood and affect are normal. Speech and behavior are normal.  ____________________________________________   LABS (all labs ordered are listed, but only abnormal results are displayed)  Labs Reviewed - No data to display ____________________________________________   ____________________________________________  RADIOLOGY  X-ray of the right wrist shows a comminuted fracture of distal radius  ____________________________________________   PROCEDURES  Procedure(s) performed: Volar OCL and sling applied by the tech, patient still neurovascularly intact post splint application   Procedures    ____________________________________________   INITIAL IMPRESSION / ASSESSMENT AND PLAN / ED COURSE  Pertinent labs & imaging results that were available during my care of the patient were reviewed by me and considered in my medical decision making (see chart for details).   Patient 43 year old female presents emergency department with right wrist pain.  See HPI  Physical exam shows an obvious deformity to the right wrist.  Neurovascular is intact  X-ray shows a comminuted fracture of the distal wrist  Explained the findings to the patient.  She is placed in a volar OCL and given a sling.  Prescription for Vicodin.  She  is given morphine 4 mg p.o. while here in the ED.  Vicodin p.o. prior to discharge.  She is to follow-up with [facility] orthopedics as a secure message was sent to Dr.POggi.  He will see her in the office.  She was discharged in stable condition in the care of her mother.    Victoria Gilmore was evaluated in Emergency Department on 08/01/2019 for the symptoms described in the history of present illness. She was evaluated in the context of the global COVID-19 pandemic, which necessitated consideration that the patient might be at risk for infection with the SARS-CoV-2 virus that causes COVID-19. Institutional protocols and algorithms that pertain to the evaluation of patients at risk for COVID-19 are in a state of rapid change based on information released by regulatory bodies including the CDC and federal and state organizations. These policies and algorithms were followed during the patient's care in the ED.   As part of my medical decision making, I reviewed the following  data within the electronic MEDICAL RECORD NUMBER Nursing notes reviewed and incorporated, Old chart reviewed, Radiograph reviewed x-ray of the right wrist shows a comminuted fracture, A consult was requested and obtained from this/these consultant(s) Orthopedics, Notes from prior ED visits and [place] Controlled Substance Database  ____________________________________________   FINAL CLINICAL IMPRESSION(S) / ED DIAGNOSES  Final diagnoses:  Closed fracture of distal end of right radius, unspecified fracture morphology, initial encounter      NEW MEDICATIONS STARTED DURING THIS VISIT:  New Prescriptions   HYDROCODONE-ACETAMINOPHEN (NORCO/VICODIN) 5-325 MG TABLET    Take 1 tablet by mouth every 6 (six) hours as needed for moderate pain.     Note:  This document was prepared using Dragon voice recognition software and may include unintentional dictation errors.    Faythe GheeFisher, Murle Hellstrom W, PA-C 08/01/19 1543    Dionne BucySiadecki,  Sebastian, MD 08/01/19 [phone]

## 2019-08-01 NOTE — ED Triage Notes (Signed)
Pt states she was trying her daughters skate board out and fell injuring her right wrist.

## 2019-08-01 NOTE — Discharge Instructions (Signed)
Follow-up with [facility].  Please call in the morning to schedule an appointment.  Elevate and ice the wrist.  Take the Vicodin for pain not controlled by ibuprofen.  Return if you are unable to feel your fingers or if they are cold.

## 2019-08-03 ENCOUNTER — Ambulatory Visit: Payer: BC Managed Care – PPO

## 2019-08-03 ENCOUNTER — Ambulatory Visit: Payer: BC Managed Care – PPO | Admitting: Anesthesiology

## 2019-08-03 ENCOUNTER — Encounter
Admission: RE | Disposition: A | Discharge status: Home / Self Care | Payer: Self-pay | Source: Home / Self Care | Attending: Orthopedic Surgery

## 2019-08-03 ENCOUNTER — Encounter: Payer: Self-pay | Admitting: *Deleted

## 2019-08-03 ENCOUNTER — Other Ambulatory Visit: Payer: Self-pay

## 2019-08-03 ENCOUNTER — Ambulatory Visit
Admission: RE | Admit: 2019-08-03 | Discharge: 2019-08-03 | Disposition: A | Discharge status: Home / Self Care | Payer: BC Managed Care – PPO | Attending: Orthopedic Surgery | Admitting: Orthopedic Surgery

## 2019-08-03 DIAGNOSIS — Y9351 Activity, roller skating (inline) and skateboarding: Secondary | ICD-10-CM | POA: Diagnosis not present

## 2019-08-03 DIAGNOSIS — K219 Gastro-esophageal reflux disease without esophagitis: Secondary | ICD-10-CM | POA: Insufficient documentation

## 2019-08-03 DIAGNOSIS — Z20828 Contact with and (suspected) exposure to other viral communicable diseases: Secondary | ICD-10-CM | POA: Insufficient documentation

## 2019-08-03 DIAGNOSIS — S52571A Other intraarticular fracture of lower end of right radius, initial encounter for closed fracture: Secondary | ICD-10-CM | POA: Insufficient documentation

## 2019-08-03 DIAGNOSIS — Z79899 Other long term (current) drug therapy: Secondary | ICD-10-CM | POA: Insufficient documentation

## 2019-08-03 DIAGNOSIS — F419 Anxiety disorder, unspecified: Secondary | ICD-10-CM | POA: Diagnosis not present

## 2019-08-03 DIAGNOSIS — E559 Vitamin D deficiency, unspecified: Secondary | ICD-10-CM | POA: Diagnosis not present

## 2019-08-03 DIAGNOSIS — Z8781 Personal history of (healed) traumatic fracture: Secondary | ICD-10-CM

## 2019-08-03 DIAGNOSIS — Z9889 Other specified postprocedural states: Secondary | ICD-10-CM

## 2019-08-03 HISTORY — PX: OPEN REDUCTION INTERNAL FIXATION (ORIF) DISTAL RADIAL FRACTURE: SHX5989

## 2019-08-03 LAB — POCT PREGNANCY, URINE: Preg Test, Ur: NEGATIVE

## 2019-08-03 LAB — SARS CORONAVIRUS 2 BY RT PCR (HOSPITAL ORDER, PERFORMED IN ~~LOC~~ HOSPITAL LAB): SARS Coronavirus 2: NEGATIVE

## 2019-08-03 SURGERY — OPEN REDUCTION INTERNAL FIXATION (ORIF) DISTAL RADIUS FRACTURE
Anesthesia: General | Site: Wrist | Laterality: Right

## 2019-08-03 MED ORDER — FAMOTIDINE 20 MG PO TABS
ORAL_TABLET | ORAL | Status: AC
  Administered 2019-08-03: 20 mg via ORAL
  Filled 2019-08-03: qty 1

## 2019-08-03 MED ORDER — FENTANYL CITRATE (PF) 100 MCG/2ML IJ SOLN
INTRAMUSCULAR | Status: DC | PRN
  Administered 2019-08-03: 50 ug via INTRAVENOUS
  Administered 2019-08-03: 12.5 ug via INTRAVENOUS
  Administered 2019-08-03: 25 ug via INTRAVENOUS
  Administered 2019-08-03: 50 ug via INTRAVENOUS
  Administered 2019-08-03 (×3): 12.5 ug via INTRAVENOUS
  Administered 2019-08-03: 25 ug via INTRAVENOUS

## 2019-08-03 MED ORDER — ACETAMINOPHEN 10 MG/ML IV SOLN
INTRAVENOUS | Status: DC | PRN
  Administered 2019-08-03: 1000 mg via INTRAVENOUS

## 2019-08-03 MED ORDER — FENTANYL CITRATE (PF) 100 MCG/2ML IJ SOLN
INTRAMUSCULAR | Status: AC
  Administered 2019-08-03: 25 ug via INTRAVENOUS
  Filled 2019-08-03: qty 2

## 2019-08-03 MED ORDER — MIDAZOLAM HCL 2 MG/2ML IJ SOLN
INTRAMUSCULAR | Status: AC
  Filled 2019-08-03: qty 2

## 2019-08-03 MED ORDER — ONDANSETRON HCL 4 MG/2ML IJ SOLN: 4.0000 mg | Freq: Four times a day (QID) | INTRAMUSCULAR | Status: DC | PRN

## 2019-08-03 MED ORDER — CEFAZOLIN SODIUM-DEXTROSE 2-4 GM/100ML-% IV SOLN
INTRAVENOUS | Status: AC
  Filled 2019-08-03: qty 100

## 2019-08-03 MED ORDER — METOCLOPRAMIDE HCL 5 MG/ML IJ SOLN: 5.0000 mg | Freq: Three times a day (TID) | INTRAMUSCULAR | Status: DC | PRN

## 2019-08-03 MED ORDER — PROMETHAZINE HCL 25 MG/ML IJ SOLN: 6.2500 mg | INTRAMUSCULAR | Status: DC | PRN

## 2019-08-03 MED ORDER — LABETALOL HCL 5 MG/ML IV SOLN
INTRAVENOUS | Status: AC
  Administered 2019-08-03: 10 mg via INTRAVENOUS
  Filled 2019-08-03: qty 4

## 2019-08-03 MED ORDER — PROPOFOL 10 MG/ML IV BOLUS
INTRAVENOUS | Status: AC
  Filled 2019-08-03: qty 20

## 2019-08-03 MED ORDER — ONDANSETRON HCL 4 MG PO TABS: 4.0000 mg | ORAL_TABLET | Freq: Four times a day (QID) | ORAL | Status: DC | PRN

## 2019-08-03 MED ORDER — HYDROMORPHONE HCL 1 MG/ML IJ SOLN: 0.5000 mg | INTRAMUSCULAR | Status: DC | PRN

## 2019-08-03 MED ORDER — OXYCODONE HCL 5 MG PO TABS
ORAL_TABLET | ORAL | Status: AC
  Filled 2019-08-03: qty 1

## 2019-08-03 MED ORDER — PROPOFOL 10 MG/ML IV BOLUS
INTRAVENOUS | Status: DC | PRN
  Administered 2019-08-03: 200 mg via INTRAVENOUS

## 2019-08-03 MED ORDER — FENTANYL CITRATE (PF) 100 MCG/2ML IJ SOLN
INTRAMUSCULAR | Status: AC
  Filled 2019-08-03: qty 2

## 2019-08-03 MED ORDER — METOCLOPRAMIDE HCL 10 MG PO TABS: 5.0000 mg | ORAL_TABLET | Freq: Three times a day (TID) | ORAL | Status: DC | PRN

## 2019-08-03 MED ORDER — CEFAZOLIN SODIUM-DEXTROSE 2-4 GM/100ML-% IV SOLN
2.0000 g | Freq: Once | INTRAVENOUS | Status: AC
  Administered 2019-08-03: 2 g via INTRAVENOUS

## 2019-08-03 MED ORDER — ACETAMINOPHEN 325 MG PO TABS: 325.0000 mg | ORAL_TABLET | Freq: Four times a day (QID) | ORAL | Status: DC | PRN

## 2019-08-03 MED ORDER — ONDANSETRON HCL 4 MG/2ML IJ SOLN
INTRAMUSCULAR | Status: AC
  Filled 2019-08-03: qty 2

## 2019-08-03 MED ORDER — DEXAMETHASONE SODIUM PHOSPHATE 10 MG/ML IJ SOLN
INTRAMUSCULAR | Status: DC | PRN
  Administered 2019-08-03: 8 mg via INTRAVENOUS

## 2019-08-03 MED ORDER — LABETALOL HCL 5 MG/ML IV SOLN
10.0000 mg | Freq: Once | INTRAVENOUS | Status: AC
  Administered 2019-08-03: 17:00:00 10 mg via INTRAVENOUS
  Filled 2019-08-03: qty 4

## 2019-08-03 MED ORDER — KETOROLAC TROMETHAMINE 15 MG/ML IJ SOLN
INTRAMUSCULAR | Status: AC
  Administered 2019-08-03: 17:00:00 15 mg via INTRAVENOUS
  Filled 2019-08-03: qty 1

## 2019-08-03 MED ORDER — DEXAMETHASONE SODIUM PHOSPHATE 10 MG/ML IJ SOLN
INTRAMUSCULAR | Status: AC
  Filled 2019-08-03: qty 1

## 2019-08-03 MED ORDER — ONDANSETRON HCL 4 MG/2ML IJ SOLN
INTRAMUSCULAR | Status: DC | PRN
  Administered 2019-08-03: 4 mg via INTRAVENOUS

## 2019-08-03 MED ORDER — LACTATED RINGERS IV SOLN
INTRAVENOUS | Status: DC
  Administered 2019-08-03: 14:00:00 via INTRAVENOUS

## 2019-08-03 MED ORDER — OXYCODONE HCL 5 MG PO TABS: 10.0000 mg | ORAL_TABLET | ORAL | Status: DC | PRN

## 2019-08-03 MED ORDER — SODIUM CHLORIDE 0.9 % IV SOLN: INTRAVENOUS | Status: DC

## 2019-08-03 MED ORDER — FENTANYL CITRATE (PF) 100 MCG/2ML IJ SOLN
25.0000 ug | INTRAMUSCULAR | Status: DC | PRN
  Administered 2019-08-03: 17:00:00 25 ug via INTRAVENOUS

## 2019-08-03 MED ORDER — LIDOCAINE HCL (CARDIAC) PF 100 MG/5ML IV SOSY
PREFILLED_SYRINGE | INTRAVENOUS | Status: DC | PRN
  Administered 2019-08-03: 60 mg via INTRAVENOUS

## 2019-08-03 MED ORDER — LIDOCAINE HCL (PF) 2 % IJ SOLN
INTRAMUSCULAR | Status: AC
  Filled 2019-08-03: qty 10

## 2019-08-03 MED ORDER — FAMOTIDINE 20 MG PO TABS
20.0000 mg | ORAL_TABLET | Freq: Once | ORAL | Status: AC
  Administered 2019-08-03: 14:00:00 20 mg via ORAL

## 2019-08-03 MED ORDER — KETOROLAC TROMETHAMINE 15 MG/ML IJ SOLN
15.0000 mg | Freq: Four times a day (QID) | INTRAMUSCULAR | Status: DC | PRN
  Administered 2019-08-03: 17:00:00 15 mg via INTRAVENOUS

## 2019-08-03 MED ORDER — FENTANYL CITRATE (PF) 100 MCG/2ML IJ SOLN
25.0000 ug | Freq: Once | INTRAMUSCULAR | Status: AC
  Administered 2019-08-03 (×2): 25 ug via INTRAVENOUS

## 2019-08-03 MED ORDER — OXYCODONE HCL 5 MG PO TABS
5.0000 mg | ORAL_TABLET | ORAL | Status: DC | PRN
  Administered 2019-08-03: 5 mg via ORAL

## 2019-08-03 MED ORDER — MIDAZOLAM HCL 2 MG/2ML IJ SOLN
INTRAMUSCULAR | Status: DC | PRN
  Administered 2019-08-03: 2 mg via INTRAVENOUS

## 2019-08-03 MED ORDER — NEOMYCIN-POLYMYXIN B GU 40-200000 IR SOLN
Status: DC | PRN
  Administered 2019-08-03: 2 mL

## 2019-08-03 MED ORDER — ACETAMINOPHEN 10 MG/ML IV SOLN
INTRAVENOUS | Status: AC
  Filled 2019-08-03: qty 100

## 2019-08-03 MED ORDER — EPHEDRINE SULFATE 50 MG/ML IJ SOLN
INTRAMUSCULAR | Status: DC | PRN
  Administered 2019-08-03 (×2): 10 mg via INTRAVENOUS

## 2019-08-03 MED ORDER — SUCCINYLCHOLINE CHLORIDE 20 MG/ML IJ SOLN
INTRAMUSCULAR | Status: DC | PRN
  Administered 2019-08-03: 20 mg via INTRAVENOUS

## 2019-08-03 SURGICAL SUPPLY — 37 items
APL PRP STRL LF DISP 70% ISPRP (MISCELLANEOUS) ×1
BNDG ELASTIC 4X5.8 VLCR STR LF (GAUZE/BANDAGES/DRESSINGS) ×3 IMPLANT
CANISTER SUCT 1200ML W/VALVE (MISCELLANEOUS) ×3 IMPLANT
CHLORAPREP W/TINT 26 (MISCELLANEOUS) ×3 IMPLANT
COVER WAND RF STERILE (DRAPES) ×3 IMPLANT
CUFF TOURN SGL QUICK 18X4 (TOURNIQUET CUFF) ×2 IMPLANT
DRAPE FLUOR MINI C-ARM 54X84 (DRAPES) ×3 IMPLANT
ELECT REM PT RETURN 9FT ADLT (ELECTROSURGICAL) ×3
ELECTRODE REM PT RTRN 9FT ADLT (ELECTROSURGICAL) ×1 IMPLANT
GAUZE SPONGE 4X4 12PLY STRL (GAUZE/BANDAGES/DRESSINGS) ×3 IMPLANT
GAUZE XEROFORM 1X8 LF (GAUZE/BANDAGES/DRESSINGS) ×6 IMPLANT
GLOVE SURG SYN 9.0  PF PI (GLOVE) ×2
GLOVE SURG SYN 9.0 PF PI (GLOVE) ×1 IMPLANT
GOWN SRG 2XL LVL 4 RGLN SLV (GOWNS) ×1 IMPLANT
GOWN STRL NON-REIN 2XL LVL4 (GOWNS) ×3
GOWN STRL REUS W/ TWL LRG LVL3 (GOWN DISPOSABLE) ×1 IMPLANT
GOWN STRL REUS W/TWL LRG LVL3 (GOWN DISPOSABLE) ×3
KIT TURNOVER KIT A (KITS) ×3 IMPLANT
NDL FILTER BLUNT 18X1 1/2 (NEEDLE) ×1 IMPLANT
NEEDLE FILTER BLUNT 18X 1/2SAF (NEEDLE) ×2
NEEDLE FILTER BLUNT 18X1 1/2 (NEEDLE) ×1 IMPLANT
NS IRRIG 500ML POUR BTL (IV SOLUTION) ×3 IMPLANT
PACK EXTREMITY ARMC (MISCELLANEOUS) ×3 IMPLANT
PAD CAST CTTN 4X4 STRL (SOFTGOODS) ×2 IMPLANT
PADDING CAST COTTON 4X4 STRL (SOFTGOODS) ×6
PEG SUBCHONDRAL SMOOTH 2.0X16 (Peg) ×2 IMPLANT
PEG SUBCHONDRAL SMOOTH 2.0X20 (Peg) ×6 IMPLANT
PEG SUBCHONDRAL SMOOTH 2.0X22 (Peg) ×6 IMPLANT
PLATE SHORT 24.4X51.3 RT (Plate) ×2 IMPLANT
SCALPEL PROTECTED #15 DISP (BLADE) ×6 IMPLANT
SCREW CORT 3.5X10 LNG (Screw) ×6 IMPLANT
SPLINT CAST 1 STEP 3X12 (MISCELLANEOUS) ×3 IMPLANT
SUT ETHILON 4-0 (SUTURE) ×3
SUT ETHILON 4-0 FS2 18XMFL BLK (SUTURE) ×1
SUT VICRYL 3-0 27IN (SUTURE) ×3 IMPLANT
SUTURE ETHLN 4-0 FS2 18XMF BLK (SUTURE) ×1 IMPLANT
SYR 3ML LL SCALE MARK (SYRINGE) ×3 IMPLANT

## 2019-08-03 NOTE — Anesthesia Preprocedure Evaluation (Addendum)
Anesthesia Evaluation  Patient identified by MRN, date of birth, ID band Patient awake    Reviewed: Allergy & Precautions, H&P , NPO status , Patient's Chart, lab work & pertinent test results, reviewed documented beta blocker date and time   History of Anesthesia Complications Negative for: history of anesthetic complications  Airway Mallampati: III  TM Distance: >3 FB Neck ROM: full    Dental  (+) Dental Advidsory Given, Teeth Intact   Pulmonary neg pulmonary ROS,    Pulmonary exam normal        Cardiovascular Exercise Tolerance: Good negative cardio ROS Normal cardiovascular exam     Neuro/Psych PSYCHIATRIC DISORDERS Anxiety negative neurological ROS     GI/Hepatic Neg liver ROS, GERD  ,  Endo/Other  negative endocrine ROS  Renal/GU negative Renal ROS  negative genitourinary   Musculoskeletal   Abdominal   Peds  Hematology negative hematology ROS (+)   Anesthesia Other Findings Past Medical History: No date: Anxiety No date: Breast pain     Comment:  b/l with menses  No date: GERD (gastroesophageal reflux disease) No date: Hematuria     Comment:  per pt neg work up No date: Low back pain     Comment:  rad to left leg No date: Varicose veins of legs No date: Vitamin D deficiency   Reproductive/Obstetrics negative OB ROS                            Anesthesia Physical Anesthesia Plan  ASA: II  Anesthesia Plan: General   Post-op Pain Management:    Induction: Intravenous  PONV Risk Score and Plan: 3 and Ondansetron, Dexamethasone, Midazolam and Promethazine  Airway Management Planned: LMA  Additional Equipment:   Intra-op Plan:   Post-operative Plan: Extubation in OR  Informed Consent: I have reviewed the patients History and Physical, chart, labs and discussed the procedure including the risks, benefits and alternatives for the proposed anesthesia with the patient  or authorized representative who has indicated his/her understanding and acceptance.     Dental Advisory Given  Plan Discussed with: Anesthesiologist, CRNA and Surgeon  Anesthesia Plan Comments:         Anesthesia Quick Evaluation

## 2019-08-03 NOTE — Transfer of Care (Signed)
Immediate Anesthesia Transfer of Care Note  Patient: Victoria Gilmore  Procedure(s) Performed: OPEN REDUCTION INTERNAL FIXATION (ORIF) DISTAL RADIAL FRACTURE (Right Wrist)  Patient Location: PACU  Anesthesia Type:General  Level of Consciousness: sedated  Airway & Oxygen Therapy: Patient Spontanous Breathing  Post-op Assessment: Report given to RN and Post -op Vital signs reviewed and stable  Post vital signs: Reviewed and stable  Last Vitals:  Vitals Value Taken Time  BP 133/80 08/03/19 1609  Temp    Pulse 104 08/03/19 1610  Resp 15 08/03/19 1610  SpO2 100 % 08/03/19 1610  Vitals shown include unvalidated device data.  Last Pain:  Vitals:   08/03/19 1450  PainSc: 3       Patients Stated Pain Goal: 3 (84/03/75 4360)  Complications: No apparent anesthesia complications

## 2019-08-03 NOTE — Discharge Instructions (Addendum)
AMBULATORY SURGERY  DISCHARGE INSTRUCTIONS   1) The drugs that you were given will stay in your system until tomorrow so for the next 24 hours you should not:  A) Drive an automobile B) Make any legal decisions C) Drink any alcoholic beverage   2) You may resume regular meals tomorrow.  Today it is better to start with liquids and gradually work up to solid foods.  You may eat anything you prefer, but it is better to start with liquids, then soup and crackers, and gradually work up to solid foods.   3) Please notify your doctor immediately if you have any unusual bleeding, trouble breathing, redness and pain at the surgery site, drainage, fever, or pain not relieved by medication.  4) Your post-operative visit with Dr.                                     is: Date:                        Time:    Please call to schedule your post-operative visit.  5) Additional Instructions: Keep arm elevated is much as possible and work on finger range of motion is much as you can tolerate starting today.  Pain medicine as previously directed.  Ice to the back of the wrist may help today and tomorrow.

## 2019-08-03 NOTE — Anesthesia Post-op Follow-up Note (Cosign Needed)
Anesthesia QCDR form completed.        

## 2019-08-03 NOTE — Anesthesia Procedure Notes (Cosign Needed)
Procedure Name: LMA Insertion Date/Time: 08/03/2019 3:00 PM Performed by: Cory Munch, RN Pre-anesthesia Checklist: Patient identified, Emergency Drugs available, Suction available, Patient being monitored and Timeout performed Patient Re-evaluated:Patient Re-evaluated prior to induction Oxygen Delivery Method: Circle system utilized Preoxygenation: Pre-oxygenation with 100% oxygen Induction Type: IV induction Ventilation: Mask ventilation without difficulty LMA: LMA inserted LMA Size: 4.0 Placement Confirmation: positive ETCO2 and breath sounds checked- equal and bilateral Tube secured with: Tape Dental Injury: Teeth and Oropharynx as per pre-operative assessment

## 2019-08-03 NOTE — Progress Notes (Signed)
Pt sitting in recliner, tolerating food and liquid. Per Dr. Kayleen Memos, pt ok to d/c home with heart rate up to 100.  HR now at 98, BP 139/83.  NAD.

## 2019-08-03 NOTE — Anesthesia Postprocedure Evaluation (Signed)
Anesthesia Post Note  Patient: Victoria Gilmore  Procedure(s) Performed: OPEN REDUCTION INTERNAL FIXATION (ORIF) DISTAL RADIAL FRACTURE (Right Wrist)  Patient location during evaluation: PACU Anesthesia Type: General Level of consciousness: awake and alert and oriented Pain management: pain level controlled Vital Signs Assessment: post-procedure vital signs reviewed and stable Respiratory status: spontaneous breathing Cardiovascular status: blood pressure returned to baseline Anesthetic complications: no     Last Vitals:  Vitals:   08/03/19 1626 08/03/19 1634  BP: (!) 144/85   Pulse: (!) 107 (!) 105  Resp: 19 14  Temp:    SpO2: 100% 97%    Last Pain:  Vitals:   08/03/19 1634  PainSc: 5                  Harryette Shuart

## 2019-08-03 NOTE — H&P (Signed)
Reviewed paper H+P, will be scanned into chart. No changes noted.  

## 2019-08-03 NOTE — Op Note (Signed)
08/03/2019  4:07 PM  PATIENT:  Victoria Gilmore  43 y.o. female  PRE-OPERATIVE DIAGNOSIS:  FRACTURE OF DISTAL END OF RIGHT RADIUS comminuted intra-articular with 3 distal fragments  POST-OPERATIVE DIAGNOSIS:  FRACTURE OF DISTAL END OF RIGHT RADIUS same  PROCEDURE:  Procedure(s): OPEN REDUCTION INTERNAL FIXATION (ORIF) DISTAL RADIAL FRACTURE (Right)  SURGEON: Laurene Footman, MD  ASSISTANTS: None  ANESTHESIA:   general  EBL:  Total I/O In: 1000 [I.V.:1000] Out: -   BLOOD ADMINISTERED:none  DRAINS: none   LOCAL MEDICATIONS USED:  NONE  SPECIMEN:  No Specimen  DISPOSITION OF SPECIMEN:  N/A  COUNTS:  YES  TOURNIQUET:   Total Tourniquet Time Documented: Upper Arm (Right) - 30 minutes Total: Upper Arm (Right) - 30 minutes   IMPLANTS: [employer] DVR standard with short plate with multiple smooth pegs and screws  DICTATION: .Dragon Dictation  patient was brought to the operating room and after adequate general anesthesia was obtained the right arm was prepped and draped in the usual sterile fashion. After patient identification and timeout procedures were completed, tourniquet was raised to 250 mmHg. A volar approach was made centered over the FCR tendon. The tendon sheath was incised and the tendon retracted radially protecting the radial artery and associated veins. The deep fascia was incised and muscles were retracted with a pronator elevated off the distal fragment and shaft. At the start of the case fingertrap traction was applied off the end of the bed and this helped restore length but dorsal displacement was not really restored with use of a Valora Corporal that was in disimpacting the fracture this was also improved. Distal first technique was utilized with the plate applied to the distal fragment and pinned into position appropriately placed all 7 peg holes filled using standard technique drilling and placing the smooth pegs. The plate was then brought down to the  shaft and screws inserted in the shaft 3 screws 10 mm in length preceded by removal of traction to make sure there is not over distraction. The tourniquet was let down the wound was irrigated with saline and mini C arm views reviewed and showing good position without penetration into the joint on oblique views of the distal radius. The wound was then closed with 3-0 Vicryl subcutaneously and 4-0 nylon in a simple erupted fashion with Xeroform 4 x 4 web roll volar splint and Ace wrap applied  PLAN OF CARE: Discharge to home after PACU  PATIENT DISPOSITION:  PACU - hemodynamically stable.

## 2019-08-04 ENCOUNTER — Encounter: Payer: Self-pay | Admitting: Orthopedic Surgery

## 2020-03-03 ENCOUNTER — Ambulatory Visit (INDEPENDENT_AMBULATORY_CARE_PROVIDER_SITE_OTHER): Payer: BC Managed Care – PPO | Admitting: Internal Medicine

## 2020-03-03 ENCOUNTER — Encounter: Payer: Self-pay | Admitting: Internal Medicine

## 2020-03-03 ENCOUNTER — Other Ambulatory Visit: Payer: Self-pay

## 2020-03-03 ENCOUNTER — Other Ambulatory Visit (HOSPITAL_COMMUNITY)
Admission: RE | Admit: 2020-03-03 | Discharge: 2020-03-03 | Disposition: A | Discharge status: Home / Self Care | Payer: BC Managed Care – PPO | Source: Ambulatory Visit | Attending: Internal Medicine | Admitting: Internal Medicine

## 2020-03-03 VITALS — BP 122/84 | HR 86 | Temp 97.6°F | Ht 67.0 in | Wt 180.8 lb

## 2020-03-03 DIAGNOSIS — E611 Iron deficiency: Secondary | ICD-10-CM

## 2020-03-03 DIAGNOSIS — D509 Iron deficiency anemia, unspecified: Secondary | ICD-10-CM

## 2020-03-03 DIAGNOSIS — Z0000 Encounter for general adult medical examination without abnormal findings: Secondary | ICD-10-CM | POA: Diagnosis not present

## 2020-03-03 DIAGNOSIS — Z124 Encounter for screening for malignant neoplasm of cervix: Secondary | ICD-10-CM | POA: Diagnosis present

## 2020-03-03 DIAGNOSIS — F419 Anxiety disorder, unspecified: Secondary | ICD-10-CM

## 2020-03-03 DIAGNOSIS — E559 Vitamin D deficiency, unspecified: Secondary | ICD-10-CM

## 2020-03-03 DIAGNOSIS — Z1389 Encounter for screening for other disorder: Secondary | ICD-10-CM

## 2020-03-03 DIAGNOSIS — Z1329 Encounter for screening for other suspected endocrine disorder: Secondary | ICD-10-CM

## 2020-03-03 DIAGNOSIS — Z1231 Encounter for screening mammogram for malignant neoplasm of breast: Secondary | ICD-10-CM

## 2020-03-03 DIAGNOSIS — Z13220 Encounter for screening for lipoid disorders: Secondary | ICD-10-CM | POA: Diagnosis not present

## 2020-03-03 LAB — CBC WITH DIFFERENTIAL/PLATELET
Basophils Absolute: 0 10*3/uL (ref 0.0–0.1)
Basophils Relative: 1 % (ref 0.0–3.0)
Eosinophils Absolute: 0.1 10*3/uL (ref 0.0–0.7)
Eosinophils Relative: 2.5 % (ref 0.0–5.0)
HCT: 36.6 % (ref 36.0–46.0)
Hemoglobin: 12.3 g/dL (ref 12.0–15.0)
Lymphocytes Relative: 30.2 % (ref 12.0–46.0)
Lymphs Abs: 1.1 10*3/uL (ref 0.7–4.0)
MCHC: 33.6 g/dL (ref 30.0–36.0)
MCV: 95.7 fl (ref 78.0–100.0)
Monocytes Absolute: 0.3 10*3/uL (ref 0.1–1.0)
Monocytes Relative: 7.6 % (ref 3.0–12.0)
Neutro Abs: 2.2 10*3/uL (ref 1.4–7.7)
Neutrophils Relative %: 58.7 % (ref 43.0–77.0)
Platelets: 240 10*3/uL (ref 150.0–400.0)
RBC: 3.82 Mil/uL — ABNORMAL LOW (ref 3.87–5.11)
RDW: 12.4 % (ref 11.5–15.5)
WBC: 3.8 10*3/uL — ABNORMAL LOW (ref 4.0–10.5)

## 2020-03-03 LAB — URINALYSIS, ROUTINE W REFLEX MICROSCOPIC
Bilirubin Urine: NEGATIVE
Ketones, ur: NEGATIVE
Leukocytes,Ua: NEGATIVE
Nitrite: NEGATIVE
Specific Gravity, Urine: 1.02 (ref 1.000–1.030)
Total Protein, Urine: NEGATIVE
Urine Glucose: NEGATIVE
Urobilinogen, UA: 0.2 (ref 0.0–1.0)
pH: 6.5 (ref 5.0–8.0)

## 2020-03-03 LAB — LIPID PANEL
Cholesterol: 148 mg/dL (ref 0–200)
HDL: 52.8 mg/dL (ref >39.00)
LDL Cholesterol: 87 mg/dL (ref 0–99)
NonHDL: 95.06
Total CHOL/HDL Ratio: 3
Triglycerides: 41 mg/dL (ref 0.0–149.0)
VLDL: 8.2 mg/dL (ref 0.0–40.0)

## 2020-03-03 LAB — COMPREHENSIVE METABOLIC PANEL
ALT: 11 U/L (ref 0–35)
AST: 14 U/L (ref 0–37)
Albumin: 3.9 g/dL (ref 3.5–5.2)
Alkaline Phosphatase: 51 U/L (ref 39–117)
BUN: 10 mg/dL (ref 6–23)
CO2: 25 mEq/L (ref 19–32)
Calcium: 8.6 mg/dL (ref 8.4–10.5)
Chloride: 107 mEq/L (ref 96–112)
Creatinine, Ser: 0.62 mg/dL (ref 0.40–1.20)
GFR: 126.71 mL/min (ref >60.00)
Glucose, Bld: 89 mg/dL (ref 70–99)
Potassium: 3.8 mEq/L (ref 3.5–5.1)
Sodium: 136 mEq/L (ref 135–145)
Total Bilirubin: 0.4 mg/dL (ref 0.2–1.2)
Total Protein: 7.1 g/dL (ref 6.0–8.3)

## 2020-03-03 LAB — VITAMIN D 25 HYDROXY (VIT D DEFICIENCY, FRACTURES): VITD: 44.88 ng/mL (ref 30.00–100.00)

## 2020-03-03 LAB — T4, FREE: Free T4: 0.97 ng/dL (ref 0.60–1.60)

## 2020-03-03 LAB — TSH: TSH: 0.8 u[IU]/mL (ref 0.35–4.50)

## 2020-03-03 MED ORDER — ALPRAZOLAM 0.5 MG PO TABS: ORAL_TABLET | ORAL | 2 refills | Status: DC

## 2020-03-03 NOTE — Patient Instructions (Addendum)
COVID-19 Vaccine Information can be found at: [email or web address] For questions related to vaccine distribution or appointments, please email [email or web address] or call [phone].   Consider therapy for drivers anxiety  [facility] in [place] (referral needed) or [facility] in [place] (no referral needed) Cognitive Behavioral Therapy Cognitive behavioral therapy (CBT) is a short-term, goal-oriented type of talk therapy. CBT can help you:  Identify patterns of thinking, feeling, and behaving that are causing you problems.  Decide how you want to think, feel, and respond to life events.  Set goals to change the beliefs and thoughts that cause you to act in ways that are not helpful for you.  Follow up on the changes that you make. What are the different types of CBT? The different types of CBT include:  Dialectical behavioral therapy (DBT). This approach is often used in group therapy, and it aids a person in managing behavior by focusing on: ? Things that cause problems to start (triggers). ? Methods of self-calming. ? Re-evaluating thinking processes.  Mindfulness-based cognitive therapy. This approach involves focusing your attention, meditating, and developing awareness of the present moment (mindfulness).  Rational emotive behavior therapy. This approach uses rational thought to reframe your thinking so it is less judgmental. Your therapist may directly challenge your thought processes.  Stress inoculation training. This approach involves planning ahead for stressful situations by practicing new thoughts and behaviors. This planning can help you avoid going back to old actions.  Acceptance and commitment therapy (ACT). This approach focuses on accepting yourself as you are and practicing mindfulness. It helps you understand what you would like to change and how you can set goals in that direction. What conditions  is CBT used to treat? CBT may help to treat:  Mental health conditions, including: ? Depression. ? Anxiety. ? Bipolar disorder. ? Eating disorders. ? Post-traumatic stress disorder (PTSD). ? Obsessive-compulsive disorder (OCD).  Insomnia and other sleep disorders.  Pain.  Stress.  Coping with loss or grief.  Coping with a difficult medical diagnosis or illness.  Relationship problems.  Emotional distress or shock (trauma). How can CBT help me? CBT may:  Give you a chance to share your thoughts, feelings, problems, and fears in a safe space.  Help you focus on specific problems.  Give you homework that helps you put theory into practice. Homework may include keeping a journal or doing thinking exercises.  Help you become aware of your patterns of thinking, feeling, and behaving, and how those three patterns affect each other.  Change your thoughts so that you can change your behaviors.  Help you chose how you want to view the world.  Teach you planned coping skills and offer better ways to deal with stress and difficult situations. To make the most of CBT, make sure you:  Find a licensed therapist whom you trust.  Take an active part in your therapy and do the homework that you are given.  Are honest about your problems.  Avoid skipping your therapy sessions. Summary  Cognitive behavioral therapy (CBT) is a short-term, goal-oriented type of talk therapy.  CBT can help you become aware of your patterns and the relationships among your thoughts, feelings, and behavior.  CBT may help mental health conditions and other problems. This information is not intended to replace advice given to you by your health care provider. Make sure you discuss any questions you have with your health care provider. Document Revised: 07/07/2019 Document Reviewed: 02/25/2017 Elsevier Patient Education  [place].  Leukopenia Leukopenia is a condition in which you have a  low number of white blood cells. White blood cells help the body to fight infections. The number of white blood cells in the body varies from person to person. There are five types of white blood cells. Two types (lymphocytes and neutrophils) make up most of the white blood cell count. When lymphocytes are low, the condition is called lymphocytopenia. When neutrophils are low, it is called neutropenia. Neutropenia is the most dangerous type of leukopenia because it can lead to dangerous infections. What are the causes? This condition is commonly caused by damage to soft tissue inside of the bones (bone marrow), which is where most white blood cells are made. Bone marrow can get damaged by:  Medicine or X-ray treatments for cancer (chemotherapy or radiation therapy).  Serious infections.  Cancer of the white blood cells (leukemia, lymphoma, or myeloma).  Medicines, including: ? Certain antibiotics. ? Certain heart medicines. ? Steroids. ? Certain medicines used to treat diseases of the immune system (autoimmune diseases), like rheumatoid arthritis. Leukopenia also happens when white blood cells are destroyed after leaving the bone marrow, which may result from:  Liver disease.  Autoimmune disease.  Vitamin B deficiencies. What are the signs or symptoms? One of the most common signs of leukopenia, especially severe neutropenia, is having a lot of bacterial infections. Different infections have different symptoms. An infection in your lungs may cause coughing. A urinary tract infection may cause frequent urination and a burning sensation. You may also get infections of the blood, skin, rectum, throat, sinuses, or ears. Some people have no symptoms. If you do have symptoms, they may include:  Fever.  Fatigue.  Swollen glands (lymph nodes).  Painful mouth ulcers.  Gum disease. How is this diagnosed? This condition may be diagnosed based on:  Your medical history.  A physical exam  to check for swollen lymph nodes and an enlarged spleen. Your spleen is an organ on the left side of your body that stores white blood cells.  Tests, such as: ? A complete blood count. This blood test counts each type of white cell. ? Bone marrow aspiration. Some bone marrow is removed to be checked under a microscope. ? Lymph node biopsy. Some lymph node tissue is removed to be checked under a microscope. ? Other types of blood tests or imaging tests. How is this treated? Treatment of leukopenia depends on the cause. Some common treatments include:  Antibiotic medicine to treat bacterial infections.  Stopping medicines that may cause leukopenia.  Medicines to stimulate neutrophil production (hematopoietic growth factors), to treat neutropenia. Follow these instructions at home:  Take over-the-counter and prescription medicines only as told by your health care provider. This includes supplements and vitamins.  If you were prescribed an antibiotic medicine, take it as told by your health care provider. Do not stop taking the antibiotic even if you start to feel better.  Preventing infection is important if you have leukopenia. To prevent infection: ? Avoid close contact with sick people. ? Wash your hands frequently with soap and water. If soap and water are not available, use hand sanitizer. ? Do not eat uncooked or undercooked meats. ? Wash fruits and vegetables before eating them. ? Do not eat or drink unpasteurized dairy products. ? Get regular dental care, and maintain good dental hygiene. You should visit the dentist at least once every 6 months.  Keep all follow-up visits as told by your health care provider. This is  important. Contact a health care provider if:  You have chills or a fever.  You have symptoms of an infection. Get help right away if:  You have a fever that lasts for more than 2-3 days.  You have symptoms that last for more than 2-3 days.  You have  trouble breathing.  You have chest pain. This information is not intended to replace advice given to you by your health care provider. Make sure you discuss any questions you have with your health care provider. Document Revised: 09/26/2017 Document Reviewed: 09/03/2016 Elsevier Patient Education  2020 [employer].

## 2020-03-03 NOTE — Progress Notes (Signed)
Chief Complaint  Patient presents with  . Annual Exam  . Gynecologic Exam   Annual  1. Anxiety with driving and attention issues hold therapy for now  Wants refill xanax    Review of Systems  Constitutional: Positive for weight loss.       Wt loss trying   HENT: Negative for hearing loss.   Respiratory: Negative for shortness of breath.   Cardiovascular: Negative for chest pain.  Gastrointestinal: Negative for abdominal pain.  Musculoskeletal: Negative for falls.  Skin: Negative for rash.  Neurological: Negative for headaches.  Psychiatric/Behavioral: Negative for depression.   Past Medical History:  Diagnosis Date  . Anxiety   . Breast pain    b/l with menses   . GERD (gastroesophageal reflux disease)   . Hematuria    per pt neg work up  . Low back pain    rad to left leg  . Varicose veins of legs   . Vitamin D deficiency    Past Surgical History:  Procedure Laterality Date  . OPEN REDUCTION INTERNAL FIXATION (ORIF) DISTAL RADIAL FRACTURE Right 08/03/2019   Procedure: OPEN REDUCTION INTERNAL FIXATION (ORIF) DISTAL RADIAL FRACTURE;  Surgeon: Hessie Knows, MD;  Location: [facility] ORS;  Service: Orthopedics;  Laterality: Right;   Family History  Problem Relation Age of Onset  . Hypertension Father   . Peripheral Artery Disease Father   . Breast cancer Neg Hx    Social History   Socioeconomic History  . Marital status: Married    Spouse name: Not on file  . Number of children: Not on file  . Years of education: Not on file  . Highest education level: Not on file  Occupational History  . Not on file  Tobacco Use  . Smoking status: Never Smoker  . Smokeless tobacco: Never Used  Substance and Sexual Activity  . Alcohol use: Yes    Comment: Very rare  . Drug use: No  . Sexual activity: Yes  Other Topics Concern  . Not on file  Social History Narrative   Graduated with Bachelor's degree in public health 7121 from [employer].   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 Difficulty of Paying Living Expenses:   Food Insecurity:   . Worried About Charity fundraiser in the Last Year:   . Arboriculturist in the Last Year:   Transportation Needs:   . Film/video editor (Medical):   Marland Kitchen Lack of Transportation (Non-Medical):   Physical Activity:   . Days of Exercise per Week:   . Minutes of Exercise per Session:   Stress:   . Feeling of Stress :   Social Connections:   . Frequency of Communication with Friends and Family:   . Frequency of Social Gatherings with Friends and Family:   . Attends Religious Services:   . Active Member of Clubs or Organizations:   . Attends Archivist Meetings:   Marland Kitchen Marital Status:   Intimate Partner Violence:   . Fear of Current or Ex-Partner:   . Emotionally Abused:   Marland Kitchen Physically Abused:   . Sexually Abused:    Current Meds  Medication Sig  . ALPRAZolam (XANAX) 0.5 MG tablet TAKE ONE TABLET BY MOUTH AT BEDTIME AS NEEDED FOR ANXIETY  . cetirizine (ZYRTEC) 10 MG tablet Take 10  mg by mouth as needed for allergies. Reported on 02/14/2016  . Cholecalciferol 50000 units capsule Take 1 capsule (50,000 Units total) by mouth once a week.  . [DISCONTINUED] ALPRAZolam (XANAX) 0.5 MG tablet TAKE ONE TABLET BY MOUTH AT BEDTIME AS NEEDED FOR ANXIETY   No Known Allergies No results found for this or any previous visit (from the past 2160 hour(s)). Objective  Body mass index is 28.32 kg/m. Wt Readings from Last 3 Encounters:  03/03/20 180 lb 12.8 oz (82 kg)  08/03/19 132 lb (59.9 kg)  08/01/19 180 lb (81.6 kg)   Temp Readings from Last 3 Encounters:  03/03/20 97.6 F (36.4 C) (Temporal)  08/03/19 98 F (36.7 C) (Temporal)  08/01/19 97.7 F (36.5 C) (Oral)   BP Readings from Last 3 Encounters:  03/03/20 122/84  08/03/19 139/83  08/01/19 115/70   Pulse Readings from Last 3  Encounters:  03/03/20 86  08/03/19 92  08/01/19 70    Physical Exam Vitals and nursing note reviewed. Exam conducted with a chaperone present.  Constitutional:      Appearance: Normal appearance. She is well-developed and well-groomed.  HENT:     Head: Normocephalic and atraumatic.  Eyes:     Conjunctiva/sclera: Conjunctivae normal.     Pupils: Pupils are equal, round, and reactive to light.  Cardiovascular:     Rate and Rhythm: Normal rate and regular rhythm.     Heart sounds: Normal heart sounds.  Pulmonary:     Effort: Pulmonary effort is normal.     Breath sounds: Normal breath sounds.  Chest:     Breasts: Breasts are symmetrical.        Right: Normal.        Left: Normal.  Abdominal:     General: Abdomen is flat. Bowel sounds are normal.     Tenderness: There is no abdominal tenderness.     Comments: +hemorrhoids   Genitourinary:    Pubic Area: No rash.      Labia:        Right: No rash, tenderness, lesion or injury.        Left: No rash, tenderness, lesion or injury.      Vagina: Normal.     Cervix: Normal.     Uterus: Normal.      Adnexa: Right adnexa normal and left adnexa normal.     Rectum: External hemorrhoid present.  Lymphadenopathy:     Upper Body:     Right upper body: No axillary adenopathy.     Left upper body: No axillary adenopathy.  Skin:    General: Skin is warm and dry.  Neurological:     General: No focal deficit present.     Mental Status: She is alert and oriented to person, place, and time. Mental status is at baseline.     Gait: Gait normal.  Psychiatric:        Attention and Perception: Attention and perception normal.        Mood and Affect: Mood and affect normal.        Speech: Speech normal.        Behavior: Behavior normal. Behavior is cooperative.        Thought Content: Thought content normal.        Cognition and Memory: Cognition and memory normal.        Judgment: Judgment normal.     Assessment  Plan  Annual  physical exam -  Fluutd Tdap utd covid vaccine  MMR immune  Hep B immune  rec D3 5000 IU daily otc   [facility] 0/1/41 h/o cyclical breast pain with menses  -ordered for future  Breast exam normal 03/03/20   Pap had 05/27/16 neg pap neg HPV h/o abnormal pap  Pap today  HIV/HCV neg in past  rec healthy diet choices and exercise to lose congrats wt loss 180 to 132 walking 3 miles daily trying Never smoker  Fasting lab stoday   Anxiety - Plan: ALPRAZolam (XANAX) 0.5 MG tablet  Cont leb phd therapy for attention w/u and cbt for anxiety with driving   Provider: Dr. Olivia Mackie McLean-Scocuzza-[facility]

## 2020-03-04 LAB — IRON,TIBC AND FERRITIN PANEL
%SAT: 37 % (calc) (ref 16–45)
Ferritin: 44 ng/mL (ref 16–232)
Iron: 107 ug/dL (ref 40–190)
TIBC: 287 mcg/dL (calc) (ref 250–450)

## 2020-03-06 LAB — CYTOLOGY - PAP
Comment: NEGATIVE
Diagnosis: NEGATIVE
High risk HPV: NEGATIVE

## 2020-03-07 ENCOUNTER — Other Ambulatory Visit: Payer: Self-pay | Admitting: Internal Medicine

## 2020-03-07 DIAGNOSIS — N3000 Acute cystitis without hematuria: Secondary | ICD-10-CM

## 2020-03-09 ENCOUNTER — Encounter: Payer: Self-pay | Admitting: Internal Medicine

## 2020-03-13 NOTE — Telephone Encounter (Signed)
Pt called and is wanting a call back about the message below

## 2020-03-14 ENCOUNTER — Encounter: Payer: Self-pay | Admitting: Internal Medicine

## 2020-04-04 ENCOUNTER — Ambulatory Visit
Admission: RE | Admit: 2020-04-04 | Discharge: 2020-04-04 | Disposition: A | Discharge status: Home / Self Care | Payer: BC Managed Care – PPO | Source: Ambulatory Visit | Attending: Internal Medicine | Admitting: Internal Medicine

## 2020-04-04 ENCOUNTER — Other Ambulatory Visit: Payer: Self-pay | Admitting: Internal Medicine

## 2020-04-04 ENCOUNTER — Other Ambulatory Visit (INDEPENDENT_AMBULATORY_CARE_PROVIDER_SITE_OTHER): Payer: BC Managed Care – PPO

## 2020-04-04 ENCOUNTER — Other Ambulatory Visit: Payer: Self-pay

## 2020-04-04 DIAGNOSIS — R739 Hyperglycemia, unspecified: Secondary | ICD-10-CM | POA: Diagnosis not present

## 2020-04-04 DIAGNOSIS — D72819 Decreased white blood cell count, unspecified: Secondary | ICD-10-CM

## 2020-04-04 DIAGNOSIS — Z1231 Encounter for screening mammogram for malignant neoplasm of breast: Secondary | ICD-10-CM

## 2020-04-04 DIAGNOSIS — N3000 Acute cystitis without hematuria: Secondary | ICD-10-CM

## 2020-04-04 DIAGNOSIS — E538 Deficiency of other specified B group vitamins: Secondary | ICD-10-CM

## 2020-04-04 LAB — CBC WITH DIFFERENTIAL/PLATELET
Basophils Absolute: 0 10*3/uL (ref 0.0–0.1)
Basophils Relative: 0.6 % (ref 0.0–3.0)
Eosinophils Absolute: 0.1 10*3/uL (ref 0.0–0.7)
Eosinophils Relative: 2.1 % (ref 0.0–5.0)
HCT: 39.1 % (ref 36.0–46.0)
Hemoglobin: 12.8 g/dL (ref 12.0–15.0)
Lymphocytes Relative: 27.5 % (ref 12.0–46.0)
Lymphs Abs: 1.3 10*3/uL (ref 0.7–4.0)
MCHC: 32.8 g/dL (ref 30.0–36.0)
MCV: 96 fl (ref 78.0–100.0)
Monocytes Absolute: 0.4 10*3/uL (ref 0.1–1.0)
Monocytes Relative: 7.9 % (ref 3.0–12.0)
Neutro Abs: 2.9 10*3/uL (ref 1.4–7.7)
Neutrophils Relative %: 61.9 % (ref 43.0–77.0)
Platelets: 232 10*3/uL (ref 150.0–400.0)
RBC: 4.07 Mil/uL (ref 3.87–5.11)
RDW: 12.3 % (ref 11.5–15.5)
WBC: 4.8 10*3/uL (ref 4.0–10.5)

## 2020-04-04 LAB — HEMOGLOBIN A1C: Hgb A1c MFr Bld: 5.2 % (ref 4.6–6.5)

## 2020-04-05 ENCOUNTER — Telehealth: Payer: Self-pay | Admitting: Internal Medicine

## 2020-04-05 LAB — URINE CULTURE
MICRO NUMBER:: 10565840
SPECIMEN QUALITY:: ADEQUATE

## 2020-04-05 LAB — URINALYSIS, ROUTINE W REFLEX MICROSCOPIC
Bilirubin Urine: NEGATIVE
Glucose, UA: NEGATIVE
Hgb urine dipstick: NEGATIVE
Ketones, ur: NEGATIVE
Leukocytes,Ua: NEGATIVE
Nitrite: NEGATIVE
Protein, ur: NEGATIVE
Specific Gravity, Urine: 1.023 (ref 1.001–1.03)
pH: 6 (ref 5.0–8.0)

## 2020-04-05 NOTE — Telephone Encounter (Signed)
-----   Message from Bevelyn Buckles, MD sent at 04/04/2020 11:36 AM EDT ----- No prediabetes  White blood cell count normal

## 2020-04-05 NOTE — Telephone Encounter (Signed)
Patient informed and verbalized understanding

## 2020-04-05 NOTE — Telephone Encounter (Signed)
-----   Message from Bevelyn Buckles, MD sent at 04/05/2020  9:46 AM EDT ----- Mammogram normal

## 2020-04-26 ENCOUNTER — Telehealth: Payer: Self-pay | Admitting: Internal Medicine

## 2020-04-26 NOTE — Telephone Encounter (Signed)
The patient came into [facility] [place] asking to switch to another provider . She asked if Dr. Milinda Antis was available at the [facility] location. However, at the time she was not accepting any new patients. I informed the patient that Dr. Selena Batten an MD at [facility] was accepting new patients. Is it agreeable with Dr. Judie Grieve and Dr. Selena Batten to allow the patient to transfer to [facility]? Please advise.

## 2020-04-26 NOTE — Telephone Encounter (Signed)
Pt to switch to whomever she would like   No problem  TMS

## 2020-04-27 NOTE — Telephone Encounter (Signed)
Not taking new patients currently  Thanks

## 2020-04-27 NOTE — Telephone Encounter (Signed)
Ok with me 

## 2020-04-27 NOTE — Telephone Encounter (Signed)
In speaking with the patient she is requesting Dr. Milinda Antis.  As she is a "transfer of care" and not a new patient per say, I will need to get permission from Dr. Milinda Antis if she is able to take her on.    Dr. Milinda Antis, please advise.    Thanks.

## 2020-04-27 NOTE — Telephone Encounter (Signed)
Noted, spoke with patient and explained that Dr. Milinda Antis not taking new patients.  We did discuss our providers here that are taking new patients and she agreed to set up with Dr. Selena Batten for a transfer, "initial consultation".  She states that she has questions about what is currently going on with her health and her lab results.   Appointment with Dr. Selena Batten has been set up for [facility] on 05/15/20 at 840am.  Patient is aware.  She does ask if we have a sooner cancellation to consider moving her up.  At this point, Dr. Elmyra Ricks schedule is very full for scheduling TOC,physicals and/or New patient appointments.   Patient thanked me for the call and looks forward to meeting everyone.

## 2020-05-05 ENCOUNTER — Ambulatory Visit: Payer: BC Managed Care – PPO | Attending: Internal Medicine

## 2020-05-05 DIAGNOSIS — Z23 Encounter for immunization: Secondary | ICD-10-CM

## 2020-05-05 NOTE — Progress Notes (Signed)
   Covid-19 Vaccination Clinic  Name:  ENYA BUREAU    MRN: [id number] DOB: [birth year]  05/05/2020  Ms. Parisien was observed post Covid-19 immunization for 15 minutes without incident. She was provided with Vaccine Information Sheet and instruction to access the V-Safe system.   Ms. Biddinger was instructed to call 911 with any severe reactions post vaccine: Marland Kitchen Difficulty breathing  . Swelling of face and throat  . A fast heartbeat  . A bad rash all over body  . Dizziness and weakness   Immunizations Administered    Name Date Dose VIS Date Route   Pfizer COVID-19 Vaccine 05/05/2020 11:40 AM 0.3 mL 12/22/2018 Intramuscular   Manufacturer: [employer]   Lot: [id number]   NDC: [address]-9

## 2020-05-15 ENCOUNTER — Encounter: Payer: Self-pay | Admitting: Family Medicine

## 2020-05-15 ENCOUNTER — Other Ambulatory Visit: Payer: Self-pay

## 2020-05-15 ENCOUNTER — Ambulatory Visit (INDEPENDENT_AMBULATORY_CARE_PROVIDER_SITE_OTHER): Payer: BC Managed Care – PPO | Admitting: Family Medicine

## 2020-05-15 VITALS — BP 118/78 | HR 73 | Temp 97.1°F | Ht 67.25 in | Wt 182.2 lb

## 2020-05-15 DIAGNOSIS — H6991 Unspecified Eustachian tube disorder, right ear: Secondary | ICD-10-CM | POA: Insufficient documentation

## 2020-05-15 DIAGNOSIS — K649 Unspecified hemorrhoids: Secondary | ICD-10-CM | POA: Diagnosis not present

## 2020-05-15 DIAGNOSIS — F410 Panic disorder [episodic paroxysmal anxiety] without agoraphobia: Secondary | ICD-10-CM | POA: Diagnosis not present

## 2020-05-15 DIAGNOSIS — H8110 Benign paroxysmal vertigo, unspecified ear: Secondary | ICD-10-CM

## 2020-05-15 DIAGNOSIS — K219 Gastro-esophageal reflux disease without esophagitis: Secondary | ICD-10-CM | POA: Diagnosis not present

## 2020-05-15 DIAGNOSIS — H6981 Other specified disorders of Eustachian tube, right ear: Secondary | ICD-10-CM

## 2020-05-15 DIAGNOSIS — R94120 Abnormal auditory function study: Secondary | ICD-10-CM | POA: Insufficient documentation

## 2020-05-15 NOTE — Assessment & Plan Note (Signed)
Cont as needed PPI and dietary management

## 2020-05-15 NOTE — Assessment & Plan Note (Signed)
Advised continued symptomatic care. If worsening could refer.

## 2020-05-15 NOTE — Assessment & Plan Note (Signed)
Pt notes some hearing loss and ear fullness. Failed hearing screen. Advised audiology and ent referral

## 2020-05-15 NOTE — Assessment & Plan Note (Signed)
Worse, referral to ENT. Noted some improvement with allergy management, however, with symptoms occurring during driving which are worsening panic disorder. Refer to ENT to further elevate in setting of eustachian tube issues as well as hearing loss wonder about alternative diagnosis for vertigo

## 2020-05-15 NOTE — Patient Instructions (Addendum)
#   Ear fullness - eustacian tube dysfunction - try allergy medication - Or flonase or Pseudoephedrine if severe   #Referral I have placed a referral to a specialist for you. You should receive a phone call from the specialty office. Make sure your voicemail is not full and that if you are able to answer your phone to unknown or new numbers.   It may take up to 2 weeks to hear about the referral. If you do not hear anything in 2 weeks, please call our office and ask to speak with the referral coordinator.     How to help anxiety - without medication.   1) Regular Exercise - walking, jogging, cycling, dancing, strength training --> Yoga has been shown in research to reduce depression and anxiety -- with even just one hour long session per week  2)  Begin a Mindfulness/Meditation practice -- this can take a little as 3 minutes and is helpful for all kinds of mood issues -- You can find resources in books -- Or you can download apps like  ---- Headspace App (which currently has free content called "Weathering the Storm") ---- Calm (which has a few free options)  ---- Insignt Timer ---- Stop, Breathe & Think  # With each of these Apps - you should decline the "start free trial" offer and as you search through the App should be able to access some of their free content. You can also chose to pay for the content if you find one that works well for you.   # Many of them also offer sleep specific content which may help with insomnia  3) Healthy Diet -- Avoid or decrease Caffeine -- Avoid or decrease Alcohol -- Drink plenty of water, have a balanced diet -- Avoid cigarettes and marijuana (as well as other recreational drugs)  4) Consider contacting a professional therapist  -- [facility] is one option. Call [phone] -- Or you can check out www.psychologytoday.com -- you can read bios of therapists and see if they accept insurance -- Check with your insurance to see if  you have coverage and who may take your insurance

## 2020-05-15 NOTE — Assessment & Plan Note (Signed)
Improves with swallowing or gum chewing - encouraged continuing and doing allergy treatment given fluid. Could try pseudoephedrine if symptoms severe or not resolving

## 2020-05-15 NOTE — Progress Notes (Signed)
Subjective:     Victoria Gilmore is a 44 y.o. female presenting for Establish Care and discuss recent labs     HPI  #Hearing - had impacted cerumen a few years ago - not sure if the episodes are related to ear damange - improves with swallowing - feels the pressure shift  - occasionally her daughter will call her and she won't hear  #Hermorrhoids - rectal pain - had severe hemorrhoids postpartum period - constipation and anal pressure - improved now - has not needed treatment recently - wondering what options there are  #Anxiety  - connected with driving - also dealing with vertigo  - worse if tired - she feels delayed when the car is moving - that her body is behind for position - restarted her allergy medication and noticed significant improvement with the car symptoms - will get panic symptoms that her vertigo symptoms will happen while driving  Does not like to take a lot of medication Prefers to self manage Cannot recall the last time she took xanax medication - will help with sleep but rare use    Review of Systems   Social History   Tobacco Use  Smoking Status Never Smoker  Smokeless Tobacco Never Used        Objective:    BP Readings from Last 3 Encounters:  05/15/20 118/78  03/03/20 122/84  08/03/19 139/83   Wt Readings from Last 3 Encounters:  05/15/20 182 lb 4 oz (82.7 kg)  03/03/20 180 lb 12.8 oz (82 kg)  08/03/19 132 lb (59.9 kg)    BP 118/78   Pulse 73   Temp (!) 97.1 F (36.2 C) (Temporal)   Ht 5' 7.25" (1.708 m)   Wt 182 lb 4 oz (82.7 kg)   LMP 05/10/2020 (Exact Date)   SpO2 100%   BMI 28.33 kg/m    Physical Exam Constitutional:      General: She is not in acute distress.    Appearance: She is well-developed. She is not diaphoretic.  HENT:     Head: Normocephalic and atraumatic.     Right Ear: External ear normal. A middle ear effusion is present. Tympanic membrane is not erythematous, retracted or bulging.      Left Ear: Tympanic membrane and external ear normal.     Nose: Nose normal.  Eyes:     Conjunctiva/sclera: Conjunctivae normal.  Cardiovascular:     Rate and Rhythm: Normal rate and regular rhythm.     Heart sounds: No murmur heard.   Pulmonary:     Effort: Pulmonary effort is normal. No respiratory distress.     Breath sounds: Normal breath sounds. No wheezing.  Musculoskeletal:     Cervical back: Neck supple.  Skin:    General: Skin is warm and dry.     Capillary Refill: Capillary refill takes less than 2 seconds.  Neurological:     Mental Status: She is alert. Mental status is at baseline.  Psychiatric:        Mood and Affect: Mood normal.        Behavior: Behavior normal.           Assessment & Plan:   Problem List Items Addressed This Visit      Cardiovascular and Mediastinum   Hemorrhoids - Primary    Advised continued symptomatic care. If worsening could refer.         Digestive   GERD (gastroesophageal reflux disease)    Cont as needed  PPI and dietary management        Nervous and Auditory   Benign paroxysmal positional vertigo    Worse, referral to ENT. Noted some improvement with allergy management, however, with symptoms occurring during driving which are worsening panic disorder. Refer to ENT to further elevate in setting of eustachian tube issues as well as hearing loss wonder about alternative diagnosis for vertigo      Relevant Orders   Ambulatory referral to ENT   Dysfunction of right eustachian tube    Improves with swallowing or gum chewing - encouraged continuing and doing allergy treatment given fluid. Could try pseudoephedrine if symptoms severe or not resolving        Other   Panic disorder    Stable. Rare use of xanax. Symptoms mostly while driving and triggered by fear of vertigo symptoms occurring. Advised therapy (number provided) and ENT to evaluate and treat vertigo to see if panic symptoms will improve. If persisting may consider  medication but given specific trigger will focus on therapy/ent support.       Failed hearing screening    Pt notes some hearing loss and ear fullness. Failed hearing screen. Advised audiology and ent referral       Relevant Orders   Ambulatory referral to Audiology       Return in about 3 months (around 08/15/2020) for anxiety.  Lynnda Child, MD  This visit occurred during the SARS-CoV-2 public health emergency.  Safety protocols were in place, including screening questions prior to the visit, additional usage of staff PPE, and extensive cleaning of exam room while observing appropriate contact time as indicated for disinfecting solutions.

## 2020-05-15 NOTE — Assessment & Plan Note (Signed)
Stable. Rare use of xanax. Symptoms mostly while driving and triggered by fear of vertigo symptoms occurring. Advised therapy (number provided) and ENT to evaluate and treat vertigo to see if panic symptoms will improve. If persisting may consider medication but given specific trigger will focus on therapy/ent support.

## 2020-05-24 ENCOUNTER — Telehealth: Payer: Self-pay | Admitting: *Deleted

## 2020-05-24 NOTE — Telephone Encounter (Signed)
Please place future orders for lab appt.   Not sure who this patient's PCP is since Dr. Selena Batten is listed in chart.

## 2020-05-24 NOTE — Telephone Encounter (Signed)
I think this lab appt needs to be canceled pt sees Dr. Selena Batten now and will need to go to [facility] for labs  No longer needed   [employer]

## 2020-05-25 NOTE — Telephone Encounter (Signed)
Lab appointment has been canceled. This was made in an attempt to have patient do repeat labs.  She has switched PCPs so this will no longer be needed.

## 2020-05-26 ENCOUNTER — Ambulatory Visit: Payer: BC Managed Care – PPO | Attending: Internal Medicine

## 2020-05-26 ENCOUNTER — Other Ambulatory Visit: Payer: BC Managed Care – PPO

## 2020-05-26 DIAGNOSIS — Z23 Encounter for immunization: Secondary | ICD-10-CM

## 2020-05-26 NOTE — Progress Notes (Signed)
   Covid-19 Vaccination Clinic  Name:  Victoria Gilmore    MRN: [id number] DOB: 01-04-1976  05/26/2020  [name] was observed post Covid-19 immunization for 15 minutes without incident. She was provided with Vaccine Information Sheet and instruction to access the V-Safe system.   [name] was instructed to call 911 with any severe reactions post vaccine: Marland Kitchen Difficulty breathing  . Swelling of face and throat  . A fast heartbeat  . A bad rash all over body  . Dizziness and weakness   Immunizations Administered    Name Date Dose VIS Date Route   Pfizer COVID-19 Vaccine 05/26/2020  8:35 AM 0.3 mL 12/22/2018 Intramuscular   Manufacturer: [employer], [employer]   Lot: [id number]   NDC: [address]-6

## 2020-06-30 ENCOUNTER — Telehealth: Payer: Self-pay

## 2020-06-30 NOTE — Telephone Encounter (Signed)
Pt left v/m that pt was seen to establish care on 05/15/20; soon pt is going to be changing jobs and will need a medical form filled out. I spoke with pt; pt will fax paperwork over the weekend to [phone] atten; Dr Selena Batten and pt will wait to hear if form is completed or if pt will need an appt to get form completed. FYI to Dr Selena Batten.

## 2020-07-04 NOTE — Telephone Encounter (Signed)
Spoke to pt and she is faxing the form to [place] in about 10 mins. Would like form faxed back to number on form and also mail a copy of completed form to her.

## 2020-07-04 NOTE — Telephone Encounter (Signed)
Paperwork not received.   Please follow-up with patient about re-sending paperwork.

## 2020-07-11 ENCOUNTER — Other Ambulatory Visit: Payer: Self-pay

## 2020-07-11 NOTE — Telephone Encounter (Signed)
Received form and put in Dr. Elmyra Ricks box. Upon Dr. Elmyra Ricks return to the office on 9/13, form was returned to me incomplete. Will call pt and ask her questions in reference to the form.

## 2020-07-12 NOTE — Telephone Encounter (Signed)
Spoke to Victoria Gilmore and got answers to certain questions on form. Completed form and placed on Dr. Elmyra Ricks desk to sign and send out in mail tomorrow (07/13/20).

## 2020-07-14 ENCOUNTER — Other Ambulatory Visit: Payer: Self-pay

## 2020-07-17 ENCOUNTER — Other Ambulatory Visit: Payer: Self-pay

## 2020-07-17 ENCOUNTER — Ambulatory Visit (LOCAL_COMMUNITY_HEALTH_CENTER): Payer: Self-pay

## 2020-07-17 DIAGNOSIS — Z111 Encounter for screening for respiratory tuberculosis: Secondary | ICD-10-CM

## 2020-07-20 ENCOUNTER — Ambulatory Visit (LOCAL_COMMUNITY_HEALTH_CENTER): Payer: BC Managed Care – PPO

## 2020-07-20 ENCOUNTER — Other Ambulatory Visit: Payer: Self-pay

## 2020-07-20 DIAGNOSIS — Z111 Encounter for screening for respiratory tuberculosis: Secondary | ICD-10-CM

## 2020-07-20 LAB — TB SKIN TEST
Induration: 0 mm
TB Skin Test: NEGATIVE

## 2020-08-10 ENCOUNTER — Other Ambulatory Visit: Payer: BC Managed Care – PPO

## 2020-08-10 ENCOUNTER — Other Ambulatory Visit: Payer: Self-pay

## 2020-08-10 DIAGNOSIS — Z20822 Contact with and (suspected) exposure to covid-19: Secondary | ICD-10-CM

## 2020-08-11 LAB — NOVEL CORONAVIRUS, NAA: SARS-CoV-2, NAA: NOT DETECTED

## 2020-08-11 LAB — SARS-COV-2, NAA 2 DAY TAT

## 2020-11-14 ENCOUNTER — Other Ambulatory Visit: Payer: Self-pay

## 2020-11-16 ENCOUNTER — Other Ambulatory Visit: Payer: Self-pay

## 2020-11-16 DIAGNOSIS — Z20822 Contact with and (suspected) exposure to covid-19: Secondary | ICD-10-CM

## 2020-11-18 LAB — SARS-COV-2, NAA 2 DAY TAT

## 2020-11-18 LAB — NOVEL CORONAVIRUS, NAA: SARS-CoV-2, NAA: NOT DETECTED

## 2021-05-15 ENCOUNTER — Encounter: Payer: BC Managed Care – PPO | Admitting: Internal Medicine

## 2021-05-16 ENCOUNTER — Ambulatory Visit: Payer: Self-pay | Admitting: Family Medicine

## 2021-05-21 ENCOUNTER — Ambulatory Visit: Payer: Self-pay | Admitting: Family Medicine

## 2021-05-23 ENCOUNTER — Ambulatory Visit: Payer: Self-pay | Admitting: Family Medicine

## 2021-05-31 ENCOUNTER — Encounter: Payer: Self-pay | Admitting: Family Medicine

## 2021-05-31 ENCOUNTER — Ambulatory Visit: Payer: BC Managed Care – PPO | Admitting: Family Medicine

## 2021-05-31 ENCOUNTER — Other Ambulatory Visit: Payer: Self-pay

## 2021-05-31 VITALS — BP 128/86 | HR 77 | Temp 98.3°F | Ht 67.25 in | Wt 188.0 lb

## 2021-05-31 DIAGNOSIS — E669 Obesity, unspecified: Secondary | ICD-10-CM

## 2021-05-31 DIAGNOSIS — F410 Panic disorder [episodic paroxysmal anxiety] without agoraphobia: Secondary | ICD-10-CM

## 2021-05-31 DIAGNOSIS — F419 Anxiety disorder, unspecified: Secondary | ICD-10-CM

## 2021-05-31 DIAGNOSIS — F408 Other phobic anxiety disorders: Secondary | ICD-10-CM

## 2021-05-31 DIAGNOSIS — E559 Vitamin D deficiency, unspecified: Secondary | ICD-10-CM

## 2021-05-31 LAB — COMPREHENSIVE METABOLIC PANEL
ALT: 13 U/L (ref 0–35)
AST: 14 U/L (ref 0–37)
Albumin: 3.9 g/dL (ref 3.5–5.2)
Alkaline Phosphatase: 47 U/L (ref 39–117)
BUN: 11 mg/dL (ref 6–23)
CO2: 27 mEq/L (ref 19–32)
Calcium: 8.9 mg/dL (ref 8.4–10.5)
Chloride: 105 mEq/L (ref 96–112)
Creatinine, Ser: 0.7 mg/dL (ref 0.40–1.20)
GFR: 104.8 mL/min (ref >60.00)
Glucose, Bld: 77 mg/dL (ref 70–99)
Potassium: 4.6 mEq/L (ref 3.5–5.1)
Sodium: 138 mEq/L (ref 135–145)
Total Bilirubin: 0.5 mg/dL (ref 0.2–1.2)
Total Protein: 7.2 g/dL (ref 6.0–8.3)

## 2021-05-31 LAB — CBC
HCT: 37.4 % (ref 36.0–46.0)
Hemoglobin: 12.4 g/dL (ref 12.0–15.0)
MCHC: 33.1 g/dL (ref 30.0–36.0)
MCV: 95 fl (ref 78.0–100.0)
Platelets: 259 10*3/uL (ref 150.0–400.0)
RBC: 3.94 Mil/uL (ref 3.87–5.11)
RDW: 12.4 % (ref 11.5–15.5)
WBC: 3.8 10*3/uL — ABNORMAL LOW (ref 4.0–10.5)

## 2021-05-31 LAB — VITAMIN D 25 HYDROXY (VIT D DEFICIENCY, FRACTURES): VITD: 37.95 ng/mL (ref 30.00–100.00)

## 2021-05-31 LAB — LIPID PANEL
Cholesterol: 153 mg/dL (ref 0–200)
HDL: 51.4 mg/dL (ref >39.00)
LDL Cholesterol: 91 mg/dL (ref 0–99)
NonHDL: 101.98
Total CHOL/HDL Ratio: 3
Triglycerides: 55 mg/dL (ref 0.0–149.0)
VLDL: 11 mg/dL (ref 0.0–40.0)

## 2021-05-31 LAB — TSH: TSH: 1.08 u[IU]/mL (ref 0.35–5.50)

## 2021-05-31 MED ORDER — PROPRANOLOL HCL 10 MG PO TABS: 10.0000 mg | ORAL_TABLET | Freq: Three times a day (TID) | ORAL | 0 refills | Status: DC | PRN

## 2021-05-31 MED ORDER — ALPRAZOLAM 0.5 MG PO TABS: ORAL_TABLET | ORAL | 0 refills | Status: DC

## 2021-05-31 NOTE — Patient Instructions (Addendum)
Try propranolol - heart rate medication, would take   Ashwagandha - herbal supplement   Mychart - if propranolol not working and could consider Hydroxyzine trial  If still not working - can consider Paroxetine (daily medication)   [place] location:   [facility] [phone]-  -Counseling/therapy (14 years and up- [facility] only)   -Psychiatry services most insurances accepted-treat and evaluate/diagnose patients and prescribe medications.  -Medication Management 45 years old to 65 years.  -Diagnoses treated: Depression/anxiety, ADHD, Substance Abuse, Bipolar Disorder, Psychotherapy, Pain Management, Spiritual Care Services.  [facility] (15 years of age and older) [phone]- Psychology services/therapy only.  [employer] [phone]- All ages-Just Therapy services   [employer] [phone]- Employee Assistance Programs-counseling only.   [facility] [facility] [phone]- All ages-Psychology and Psychiatrist services (evaluate, treat, diagnose, prescribe medication) Treat all the diagnoses that would fall under Behavioral issues.   For new patients, on Monday, Wednesday and Friday from 8 am to 3 pm patient would just walk in and fill out paperwork and they would get patient enrolled in the services they need based on their answers.   [employer] [phone]- All ages. Monday through Friday-9am to 4 pm walk in times for patients to come in and be evaluated. Medication Management only at this time. No therapy available.   [place] location:   [facility] Address: [address] [place] [address] Phone: [phone] Counseling and psychiatry services.   [facility] and [place] locations)- [phone]- all ages, only therapy/counseling services/psychological evaluations. Services: aptitude testing, academic achievement testing,  learning Disability evaluation, ADHD evaluations, psycho-educational evaluations, readiness for kindergarten Evaluations.   [place] [phone] location only has therapy services right now  [facility] [phone] services only. Works off [employer] in [employer].

## 2021-05-31 NOTE — Assessment & Plan Note (Signed)
Screening labs. Encouraged healthy diet and exercise.

## 2021-05-31 NOTE — Progress Notes (Signed)
Subjective:     Victoria Gilmore is a 45 y.o. female presenting for Anxiety     HPI  #Driving anxiety - apprehensive about leaving the house - husband does most of the driving - can drive locally  - is taking a job with a 30 minute commute  - had not driving on the interstate for 2 years - but has been able get on the interstate for 15-20 minutes - has not tried propranolol or hydroxyzine -   Review of Systems   Social History   Tobacco Use  Smoking Status Never  Smokeless Tobacco Never        Objective:    BP Readings from Last 3 Encounters:  05/31/21 128/86  05/15/20 118/78  03/03/20 122/84   Wt Readings from Last 3 Encounters:  05/31/21 188 lb (85.3 kg)  05/15/20 182 lb 4 oz (82.7 kg)  03/03/20 180 lb 12.8 oz (82 kg)    BP 128/86   Pulse 77   Temp 98.3 F (36.8 C) (Temporal)   Ht 5' 7.25" (1.708 m)   Wt 188 lb (85.3 kg)   SpO2 97%   BMI 29.23 kg/m    Physical Exam Constitutional:      General: She is not in acute distress.    Appearance: She is well-developed. She is not diaphoretic.  HENT:     Right Ear: External ear normal.     Left Ear: External ear normal.  Eyes:     Conjunctiva/sclera: Conjunctivae normal.  Cardiovascular:     Rate and Rhythm: Normal rate.  Pulmonary:     Effort: Pulmonary effort is normal.  Musculoskeletal:     Cervical back: Neck supple.  Skin:    General: Skin is warm and dry.     Capillary Refill: Capillary refill takes less than 2 seconds.  Neurological:     Mental Status: She is alert. Mental status is at baseline.  Psychiatric:        Mood and Affect: Mood normal.        Behavior: Behavior normal.    GAD 7 : Generalized Anxiety Score 05/31/2021 05/15/2020  Nervous, Anxious, on Edge 1 1  Control/stop worrying 0 1  Worry too much - different things 1 1  Trouble relaxing 0 0  Restless 0 0  Easily annoyed or irritable 0 0  Afraid - awful might happen 1 0  Total GAD 7 Score 3 3  Anxiety Difficulty  Somewhat difficult Not difficult at all    Depression screen [facility] 2/9 05/31/2021 05/15/2020 05/26/2018  Decreased Interest 0 3 0  Down, Depressed, Hopeless 0 0 0  PHQ - 2 Score 0 3 0  Altered sleeping 1 0 -  Tired, decreased energy 1 1 -  Change in appetite 0 0 -  Feeling bad or failure about yourself  0 0 -  Trouble concentrating 0 0 -  Moving slowly or fidgety/restless 0 0 -  Suicidal thoughts 0 0 -  PHQ-9 Score 2 4 -  Difficult doing work/chores Somewhat difficult - -         Assessment & Plan:   Problem List Items Addressed This Visit       Other   Panic disorder    Rare use of xanax. Refill provided. Therapy information provided       Relevant Medications   ALPRAZolam (XANAX) 0.5 MG tablet   Obesity (BMI 30-39.9)    Screening labs. Encouraged healthy diet and exercise.  Relevant Orders   TSH (Completed)   Comprehensive metabolic panel (Completed)   Lipid panel (Completed)   CBC (Completed)   Vitamin D deficiency    Previously low. Will reassess given worsening mood.        Relevant Orders   VITAMIN D 25 Hydroxy (Vit-D Deficiency, Fractures) (Completed)   Other phobic anxiety disorders - Primary    Pt with fear of driving. Discussed that therapy is the best long-term plan as Benzos are sedating. Numbers provided. Trial of propranolol 10-20 mg prior to driving. If ineffective can consider hydroxyzine if no sedation. Also discussed starting ashwagandha. If still severe, could consider paroxitine as a study did show reduction in symptoms vs placebo       Relevant Medications   propranolol (INDERAL) 10 MG tablet   ALPRAZolam (XANAX) 0.5 MG tablet   Other Visit Diagnoses     Anxiety       Relevant Medications   ALPRAZolam (XANAX) 0.5 MG tablet        Return if symptoms worsen or fail to improve.  Lynnda Child, MD  This visit occurred during the SARS-CoV-2 public health emergency.  Safety protocols were in place, including screening questions  prior to the visit, additional usage of staff PPE, and extensive cleaning of exam room while observing appropriate contact time as indicated for disinfecting solutions.

## 2021-05-31 NOTE — Assessment & Plan Note (Signed)
Previously low. Will reassess given worsening mood.

## 2021-05-31 NOTE — Assessment & Plan Note (Signed)
Rare use of xanax. Refill provided. Therapy information provided

## 2021-05-31 NOTE — Assessment & Plan Note (Signed)
Pt with fear of driving. Discussed that therapy is the best long-term plan as Benzos are sedating. Numbers provided. Trial of propranolol 10-20 mg prior to driving. If ineffective can consider hydroxyzine if no sedation. Also discussed starting ashwagandha. If still severe, could consider paroxitine as a study did show reduction in symptoms vs placebo

## 2022-02-08 ENCOUNTER — Ambulatory Visit: Payer: BC Managed Care – PPO | Admitting: Family Medicine

## 2022-02-08 ENCOUNTER — Encounter: Payer: Self-pay | Admitting: Family Medicine

## 2022-02-08 VITALS — BP 108/78 | HR 73 | Temp 98.0°F | Ht 67.25 in | Wt 186.5 lb

## 2022-02-08 DIAGNOSIS — M545 Low back pain, unspecified: Secondary | ICD-10-CM | POA: Insufficient documentation

## 2022-02-08 DIAGNOSIS — M25511 Pain in right shoulder: Secondary | ICD-10-CM | POA: Diagnosis not present

## 2022-02-08 DIAGNOSIS — M25512 Pain in left shoulder: Secondary | ICD-10-CM | POA: Diagnosis not present

## 2022-02-08 NOTE — Patient Instructions (Signed)
Home exercise routine ?- low back pain ?- shoulder support ?

## 2022-02-08 NOTE — Assessment & Plan Note (Signed)
Suspect this may be some mild arthritis.  Exercises given to strengthen the shoulders.  If symptoms worsen consider sports medicine versus physical therapy course. ?

## 2022-02-08 NOTE — Assessment & Plan Note (Signed)
Location of pain is more lumbar gluteal area.  Home exercise routine provided if worsening consider physical therapy. ?

## 2022-02-08 NOTE — Progress Notes (Signed)
? ?Subjective:  ? ?  ?Victoria Gilmore is a 46 y.o. female presenting for Joint Pain (In both shoulders x 2 months after working out) ?  ? ? ?HPI ? ?#Shoulder pain ?- also has hip pain on the left side ?- was using a bungie and holding her weight and then the next day had shoulder soreness on both sides ?- then started having radiating pain ?- tense muscles - neck, back, front shoulders ?- treatment: ibuprofen ?- lingering symptoms ?- still having intense pain 2 weeks later - using heating pads, massage, nsaids ?- would be painful overnight and stiff in the AM ?- February 19th - was when it started ?- in the morning - has aching pain bilateral shoulder joints ?- using more pillows ?- not taking ibuprofen now ?- sometimes in the middle of night has significant pain ?- has not returned to exercising - but is able lift groceries w/o pain now ?- but will only wear a backpack for a short period now ? ?Left hip pain ?- previously only in the AM ?- worse when sitting for a long time and then weight bearing ? ? ? ?Review of Systems ? ? ?Social History  ? ?Tobacco Use  ?Smoking Status Never  ?Smokeless Tobacco Never  ? ? ? ?   ?Objective:  ?  ?BP Readings from Last 3 Encounters:  ?02/08/22 108/78  ?05/31/21 128/86  ?05/15/20 118/78  ? ?Wt Readings from Last 3 Encounters:  ?02/08/22 186 lb 8 oz (84.6 kg)  ?05/31/21 188 lb (85.3 kg)  ?05/15/20 182 lb 4 oz (82.7 kg)  ? ? ?BP 108/78   Pulse 73   Temp 98 ?F (36.7 ?C) (Oral)   Ht 5' 7.25" (1.708 m)   Wt 186 lb 8 oz (84.6 kg)   LMP 02/08/2022 (Exact Date)   SpO2 100%   BMI 28.99 kg/m?  ? ? ?Physical Exam ?Constitutional:   ?   General: She is not in acute distress. ?   Appearance: She is well-developed. She is not diaphoretic.  ?HENT:  ?   Right Ear: External ear normal.  ?   Left Ear: External ear normal.  ?   Nose: Nose normal.  ?Eyes:  ?   Conjunctiva/sclera: Conjunctivae normal.  ?Cardiovascular:  ?   Rate and Rhythm: Normal rate.  ?Pulmonary:  ?   Effort: Pulmonary  effort is normal.  ?Musculoskeletal:  ?   Cervical back: Neck supple.  ?   Comments: Shoulder ?Inspection: no step off or abnormality ?Palpation: No joint line or biceps tenderness to palpation. ?Range of motion: Appears to be normal bilaterally, however pain at approximately 90 degrees of abduction on the left. ?Strength: Normal bilaterally. ?Rotator cuff testing within normal limits.  No signs of impingement syndrome with negative Neer's and Hawkins. ? ?Left hip ?Inspection: no abnormality ?Palpation: no lateral, anterior ttp ?ROM: normal, neg faber ?Strength: normal  ?Skin: ?   General: Skin is warm and dry.  ?   Capillary Refill: Capillary refill takes less than 2 seconds.  ?Neurological:  ?   Mental Status: She is alert. Mental status is at baseline.  ?Psychiatric:     ?   Mood and Affect: Mood normal.     ?   Behavior: Behavior normal.  ? ? ? ? ? ?   ?Assessment & Plan:  ? ?Problem List Items Addressed This Visit   ? ?  ? Other  ? Acute pain of both shoulders - Primary  ?  Suspect  this may be some mild arthritis.  Exercises given to strengthen the shoulders.  If symptoms worsen consider sports medicine versus physical therapy course. ? ?  ?  ? Acute left-sided low back pain without sciatica  ?  Location of pain is more lumbar gluteal area.  Home exercise routine provided if worsening consider physical therapy. ? ?  ?  ? ? ? ?Return if symptoms worsen or fail to improve. ? ?Lynnda Child, MD ? ? ? ?

## 2022-05-10 ENCOUNTER — Ambulatory Visit (INDEPENDENT_AMBULATORY_CARE_PROVIDER_SITE_OTHER): Payer: BC Managed Care – PPO | Admitting: Family Medicine

## 2022-05-10 VITALS — BP 110/70 | HR 89 | Temp 97.6°F | Ht 67.0 in | Wt 193.2 lb

## 2022-05-10 DIAGNOSIS — K649 Unspecified hemorrhoids: Secondary | ICD-10-CM

## 2022-05-10 DIAGNOSIS — R94120 Abnormal auditory function study: Secondary | ICD-10-CM

## 2022-05-10 DIAGNOSIS — E559 Vitamin D deficiency, unspecified: Secondary | ICD-10-CM | POA: Diagnosis not present

## 2022-05-10 DIAGNOSIS — Z1211 Encounter for screening for malignant neoplasm of colon: Secondary | ICD-10-CM

## 2022-05-10 DIAGNOSIS — Z1231 Encounter for screening mammogram for malignant neoplasm of breast: Secondary | ICD-10-CM

## 2022-05-10 DIAGNOSIS — Z0000 Encounter for general adult medical examination without abnormal findings: Secondary | ICD-10-CM | POA: Diagnosis not present

## 2022-05-10 DIAGNOSIS — E669 Obesity, unspecified: Secondary | ICD-10-CM

## 2022-05-10 DIAGNOSIS — D72819 Decreased white blood cell count, unspecified: Secondary | ICD-10-CM

## 2022-05-10 LAB — LIPID PANEL
Cholesterol: 150 mg/dL (ref 0–200)
HDL: 55.9 mg/dL (ref >39.00)
LDL Cholesterol: 86 mg/dL (ref 0–99)
NonHDL: 93.96
Total CHOL/HDL Ratio: 3
Triglycerides: 38 mg/dL (ref 0.0–149.0)
VLDL: 7.6 mg/dL (ref 0.0–40.0)

## 2022-05-10 LAB — CBC
HCT: 38.4 % (ref 36.0–46.0)
Hemoglobin: 12.5 g/dL (ref 12.0–15.0)
MCHC: 32.6 g/dL (ref 30.0–36.0)
MCV: 95.5 fl (ref 78.0–100.0)
Platelets: 239 10*3/uL (ref 150.0–400.0)
RBC: 4.02 Mil/uL (ref 3.87–5.11)
RDW: 12.6 % (ref 11.5–15.5)
WBC: 4.4 10*3/uL (ref 4.0–10.5)

## 2022-05-10 LAB — COMPREHENSIVE METABOLIC PANEL
ALT: 18 U/L (ref 0–35)
AST: 19 U/L (ref 0–37)
Albumin: 4 g/dL (ref 3.5–5.2)
Alkaline Phosphatase: 44 U/L (ref 39–117)
BUN: 10 mg/dL (ref 6–23)
CO2: 27 mEq/L (ref 19–32)
Calcium: 9 mg/dL (ref 8.4–10.5)
Chloride: 106 mEq/L (ref 96–112)
Creatinine, Ser: 0.73 mg/dL (ref 0.40–1.20)
GFR: 99 mL/min (ref >60.00)
Glucose, Bld: 92 mg/dL (ref 70–99)
Potassium: 4.7 mEq/L (ref 3.5–5.1)
Sodium: 138 mEq/L (ref 135–145)
Total Bilirubin: 0.4 mg/dL (ref 0.2–1.2)
Total Protein: 7.1 g/dL (ref 6.0–8.3)

## 2022-05-10 LAB — VITAMIN D 25 HYDROXY (VIT D DEFICIENCY, FRACTURES): VITD: 29.43 ng/mL — ABNORMAL LOW (ref 30.00–100.00)

## 2022-05-10 NOTE — Patient Instructions (Addendum)
Please call the location of your choice from the menu below to schedule your Mammogram and/or Bone Density appointment.    [facility]   [facility]                      Phone:  [phone] [address]                               [place], [place] [address]                                                             Services: Traditional and 3D Mammogram, Bone Density   #Referral I have placed a referral to a specialist for you. You should receive a phone call from the specialty office. Make sure your voicemail is not full and that if you are able to answer your phone to unknown or new numbers.   It may take up to 2 weeks to hear about the referral. If you do not hear anything in 2 weeks, please call our office and ask to speak with the referral coordinator.   Let me know if you want to get a referral for the hemorrhoid

## 2022-05-10 NOTE — Progress Notes (Signed)
Annual Exam   Chief Complaint:  Chief Complaint  Patient presents with   Annual Exam    History of Present Illness:  Ms. Victoria Gilmore is a 46 y.o. No obstetric history on file. who LMP was Patient's last menstrual period was 04/28/2022 (exact date)., presents today for her annual examination.    #hemorrhoid - chronic  - painful with prolonged sitting - or if tight clothing   Nutrition Diet: not the best Exercise: going to gym She does get adequate calcium and Vitamin D in her diet.   Social History   Tobacco Use  Smoking Status Never  Smokeless Tobacco Never   Social History   Substance and Sexual Activity  Alcohol Use Yes   Comment: twice a year   Social History   Substance and Sexual Activity  Drug Use No    Safety The patient wears seatbelts: yes.     The patient feels safe at home and in their relationships: yes.  General Health Dentist in the last year: Yes Eye doctor: yes  Menstrual Her menses are regular - with a week of bleeding, does have some cramps  GYN She is single partner, contraception - condoms most of the time.    Cervical Cancer Screening:   Last Pap:   May 2021 Results were: no abnormalities /neg HPV DNA    Breast Cancer Screening There is no FH of breast cancer. There is no FH of ovarian cancer. BRCA screening Not Indicated.   Family History  Problem Relation Age of Onset   Hypertension Father    Peripheral Artery Disease Father    Cancer Paternal Grandmother        unsure what type   Breast cancer Neg Hx     Discussed that for average risk women between age 69-49 screening may reduce the risk of breast cancer death, however, at a lower rate than those over age 1. And that the the false-positive rates resulting in unnecessary biopsies with more screening is higher. The balance of benefits vs harms likely improves as you progress through your 40s. The patient does want a mammogram this year.   Colon Cancer Screening:   Age 84-75 yo - benefits outweigh the risk. Adults 29-85 yo who have never been screened benefit.  Benefits: 134000 people in 2016 will be diagnosed and 49,000 will die - early detection helps Harms: Complications 2/2 to colonoscopy High Risk (Colonoscopy): genetic disorder (Lynch syndrome or familial adenomatous polyposis), personal hx of IBD, previous adenomatous polyp, or previous colorectal cancer, FamHx start 10 years before the age at diagnosis, increased in males and black race  Options:  FIT - looks for hemoglobin (blood in the stool) - specific and fairly sensitive - must be done annually Cologuard - looks for DNA and blood - more sensitive - therefore can have more false positives, every 3 years Colonoscopy - every 10 years if normal - sedation, bowl prep, must have someone drive you  Shared decision making and the patient had decided to do colonoscopy.  Weight Wt Readings from Last 3 Encounters:  05/10/22 193 lb 4 oz (87.7 kg)  02/08/22 186 lb 8 oz (84.6 kg)  05/31/21 188 lb (85.3 kg)   Patient has high BMI  BMI Readings from Last 1 Encounters:  05/10/22 30.27 kg/m     Chronic disease screening Blood pressure monitoring:  BP Readings from Last 3 Encounters:  05/10/22 110/70  02/08/22 108/78  05/31/21 128/86    Lipid Monitoring: Indication for screening:  age >71, obesity, diabetes, family hx, CV risk factors.  Lipid screening: Yes  Lab Results  Component Value Date   CHOL 153 05/31/2021   HDL 51.40 05/31/2021   [place] 91 05/31/2021   LDLDIRECT 89.4 04/10/2011   TRIG 55.0 05/31/2021   CHOLHDL 3 05/31/2021     Diabetes Screening: age >50, overweight, family hx, PCOS, hx of gestational diabetes, at risk ethnicity Diabetes Screening screening: Yes  Lab Results  Component Value Date   HGBA1C 5.2 04/04/2020     Past Medical History:  Diagnosis Date   Anxiety    Breast pain    b/l with menses    GERD (gastroesophageal reflux disease)    Hematuria     per pt neg work up   Low back pain    rad to left leg   Varicose veins of legs    Vitamin D deficiency     Past Surgical History:  Procedure Laterality Date   OPEN REDUCTION INTERNAL FIXATION (ORIF) DISTAL RADIAL FRACTURE Right 08/03/2019   Procedure: OPEN REDUCTION INTERNAL FIXATION (ORIF) DISTAL RADIAL FRACTURE;  Surgeon: Hessie Knows, MD;  Location: [facility] ORS;  Service: Orthopedics;  Laterality: Right;    Prior to Admission medications   Medication Sig Start Date End Date Taking? Authorizing Provider  ALPRAZolam Duanne Moron) 0.5 MG tablet TAKE ONE TABLET BY MOUTH AT BEDTIME AS NEEDED FOR ANXIETY 05/31/21  Yes Lesleigh Noe, MD  cetirizine (ZYRTEC) 10 MG tablet Take 10 mg by mouth as needed for allergies. Reported on 02/14/2016   Yes [provider]  Cholecalciferol (VITAMIN D) 125 MCG (5000 UT) CAPS Take by mouth daily.   Yes [provider]  ibuprofen (ADVIL,MOTRIN) 200 MG tablet Take 400 mg by mouth every 6 (six) hours as needed for moderate pain.   Yes [provider]  Lidocaine-Hydrocortisone Ace (LIDOCAINE-HYDROCORT, PERIANAL,) 3-0.5 % CREA Apply 1 application topically 2 (two) times daily as needed. 02/23/19  Yes McLean-Scocuzza, Nino Glow, MD  pantoprazole (PROTONIX) 40 MG tablet  10/05/16  Yes [provider]  propranolol (INDERAL) 10 MG tablet Take 1 tablet (10 mg total) by mouth 3 (three) times daily as needed (30 minutes prior to driving or anxiety event). 05/31/21  Yes Lesleigh Noe, MD    No Known Allergies  Gynecologic History: Patient's last menstrual period was 04/28/2022 (exact date).  Obstetric History: No obstetric history on file.  Social History   Socioeconomic History   Marital status: Married    Spouse name: Darrell   Number of children: 1   Years of education: Masters    Highest education level: Not on file  Occupational History   Not on file  Tobacco Use   Smoking status: Never   Smokeless tobacco: Never  Vaping Use    Vaping Use: Never used  Substance and Sexual Activity   Alcohol use: Yes    Comment: twice a year   Drug use: No   Sexual activity: Yes    Birth control/protection: Condom  Other Topics Concern   Not on file  Social History Narrative   05/15/20   From: the area   Living: with husband Darrell (2005), and daughter   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   Has degree in [place]      Family: Daughter - Noah Delaine (2007)      Enjoys: work, reading, Immunologist, travel, hike, running      Exercise: daily   Diet:  could be better - high protein breakfast      Safety   Seat belts: Yes    Guns: Yes  and secure   Safe in relationships: Yes          Social Determinants of Health   Financial Resource Strain: Not on file  Food Insecurity: Not on file  Transportation Needs: Not on file  Physical Activity: Not on file  Stress: Not on file  Social Connections: Not on file  Intimate Partner Violence: Not on file    Family History  Problem Relation Age of Onset   Hypertension Father    Peripheral Artery Disease Father    Cancer Paternal Grandmother        unsure what type   Breast cancer Neg Hx     Review of Systems  Constitutional:  Negative for chills and fever.  HENT:  Negative for congestion and sore throat.   Eyes:  Negative for blurred vision and double vision.  Respiratory:  Negative for shortness of breath.   Cardiovascular:  Negative for chest pain.  Gastrointestinal:  Negative for heartburn, nausea and vomiting.  Genitourinary: Negative.   Musculoskeletal: Negative.  Negative for myalgias.  Skin:  Negative for rash.  Neurological:  Negative for dizziness and headaches.  Endo/Heme/Allergies:  Does not bruise/bleed easily.  Psychiatric/Behavioral:  Negative for depression. The patient is not nervous/anxious.      Physical Exam BP 110/70   Pulse 89   Temp 97.6 F (36.4 C) (Temporal)   Ht 5' 7"  (1.702 m)    Wt 193 lb 4 oz (87.7 kg)   LMP 04/28/2022 (Exact Date)   SpO2 97%   BMI 30.27 kg/m    BP Readings from Last 3 Encounters:  05/10/22 110/70  02/08/22 108/78  05/31/21 128/86      Physical Exam Constitutional:      General: She is not in acute distress.    Appearance: She is well-developed. She is not diaphoretic.  HENT:     Head: Normocephalic and atraumatic.     Right Ear: External ear normal. There is impacted cerumen.     Left Ear: External ear normal. There is no impacted cerumen.     Nose: Nose normal.  Eyes:     General: No scleral icterus.    Extraocular Movements: Extraocular movements intact.     Conjunctiva/sclera: Conjunctivae normal.  Cardiovascular:     Rate and Rhythm: Normal rate and regular rhythm.     Heart sounds: No murmur heard. Pulmonary:     Effort: Pulmonary effort is normal. No respiratory distress.     Breath sounds: Normal breath sounds. No wheezing.  Abdominal:     General: Bowel sounds are normal. There is no distension.     Palpations: Abdomen is soft. There is no mass.     Tenderness: There is no abdominal tenderness. There is no guarding or rebound.  Musculoskeletal:        General: Normal range of motion.     Cervical back: Neck supple.  Lymphadenopathy:     Cervical: No cervical adenopathy.  Skin:    General: Skin is warm and dry.     Capillary Refill: Capillary refill takes less than 2 seconds.  Neurological:     Mental Status: She is alert and oriented to person, place, and time.     Deep Tendon Reflexes: Reflexes normal.  Psychiatric:        Mood and Affect: Mood normal.  Behavior: Behavior normal.      Results:  PHQ-9:  [facility] Office Visit from 05/31/2021 in [place] at [place]  PHQ-9 Total Score 2         Assessment: 46 y.o. No obstetric history on file. female here for routine annual physical examination.  Plan: Problem List Items Addressed This Visit       Cardiovascular and  Mediastinum   Hemorrhoids   Relevant Orders   CBC     Other   Obesity (BMI 30-39.9)   Relevant Orders   Comprehensive metabolic panel   Lipid panel   Vitamin D deficiency   Relevant Orders   Vitamin D, 25-hydroxy   Failed hearing screening   Relevant Orders   Ambulatory referral to Audiology   Other Visit Diagnoses     Annual physical exam    -  Primary   Relevant Orders   Comprehensive metabolic panel   Lipid panel   Leukopenia, unspecified type       Relevant Orders   CBC   Encounter for screening mammogram for malignant neoplasm of breast       Relevant Orders   MM 3D SCREEN BREAST BILATERAL   Screening for colon cancer       Relevant Orders   Ambulatory referral to Gastroenterology       Screening: -- Blood pressure screen normal -- cholesterol screening: will obtain -- Weight screening: overweight: continue to monitor -- Diabetes Screening: will obtain -- Nutrition: Encouraged healthy diet  The 10-year ASCVD risk score (Arnett DK, et al., 2019) is: 0.5%   Values used to calculate the score:     Age: 37 years     Sex: Female     Is Non-Hispanic African American: Yes     Diabetic: No     Tobacco smoker: No     Systolic Blood Pressure: 741 mmHg     Is BP treated: No     HDL Cholesterol: 51.4 mg/dL     Total Cholesterol: 153 mg/dL  -- Statin therapy for Age 31-75 with CVD risk >7.5%  Psych -- Depression screening (PHQ-9):  [facility] Visit from 05/31/2021 in [place] at [place]  PHQ-9 Total Score 2        Safety -- tobacco screening: not using -- alcohol screening:  low-risk usage. -- no evidence of domestic violence or intimate partner violence.   Cancer Screening -- pap smear not collected per ASCCP guidelines -- family history of breast cancer screening: done. not at high risk. -- Mammogram - ordered -- Colon cancer (age 69+)-- ordered  Immunizations Immunization History  Administered Date(s) Administered    Influenza, Quadrivalent, Recombinant, Inj, Pf 07/29/2019   Influenza,inj,Quad PF,6+ Mos 11/15/2016   Influenza-Unspecified 06/28/2012   PFIZER(Purple Top)SARS-COV-2 Vaccination 05/05/2020, 05/26/2020   PPD Test 07/17/2020   Tdap 05/27/2016    -- flu vaccine up to date -- TDAP q10 years up to date --  -- Covid-19 Vaccine up to date   Encouraged healthy diet and exercise. Encouraged regular vision and dental care.   Lesleigh Noe, MD

## 2022-05-13 ENCOUNTER — Other Ambulatory Visit: Payer: Self-pay | Admitting: Family Medicine

## 2022-05-13 DIAGNOSIS — E559 Vitamin D deficiency, unspecified: Secondary | ICD-10-CM

## 2022-05-13 MED ORDER — VITAMIN D (ERGOCALCIFEROL) 1.25 MG (50000 UNIT) PO CAPS: 50000.0000 [IU] | ORAL_CAPSULE | ORAL | 1 refills | Status: DC

## 2022-05-21 ENCOUNTER — Ambulatory Visit
Admission: RE | Admit: 2022-05-21 | Discharge: 2022-05-21 | Disposition: A | Discharge status: Home / Self Care | Payer: BC Managed Care – PPO | Source: Ambulatory Visit | Attending: Family Medicine | Admitting: Family Medicine

## 2022-05-21 DIAGNOSIS — Z1231 Encounter for screening mammogram for malignant neoplasm of breast: Secondary | ICD-10-CM

## 2023-05-30 ENCOUNTER — Telehealth: Payer: Self-pay | Admitting: Family Medicine

## 2023-05-30 NOTE — Telephone Encounter (Signed)
Pt called stating she was a previous [facility]ullo pt in 2018 and would like to know if she can be taken on again. Pt stated she was having anxiety because of her not getting an appointment when needed so she transferred to [facility] now she is gone so the pt want to come back

## 2023-06-02 NOTE — Telephone Encounter (Signed)
Please call the patient to inform them of the providers decision.

## 2023-06-02 NOTE — Telephone Encounter (Signed)
I called pt and told her what the provider stated about not being able to take on any more patients and she thanked me and stated she has to figure out something to do

## 2023-06-13 ENCOUNTER — Encounter: Payer: Self-pay | Admitting: Family Medicine

## 2023-06-13 ENCOUNTER — Ambulatory Visit: Payer: BC Managed Care – PPO | Admitting: Family Medicine

## 2023-06-13 VITALS — BP 124/79 | HR 74 | Temp 97.8°F | Ht 67.0 in | Wt 192.5 lb

## 2023-06-13 DIAGNOSIS — R030 Elevated blood-pressure reading, without diagnosis of hypertension: Secondary | ICD-10-CM

## 2023-06-13 DIAGNOSIS — Z131 Encounter for screening for diabetes mellitus: Secondary | ICD-10-CM | POA: Diagnosis not present

## 2023-06-13 LAB — CBC WITH DIFFERENTIAL/PLATELET
Basophils Absolute: 0 10*3/uL (ref 0.0–0.1)
Basophils Relative: 0.5 % (ref 0.0–3.0)
Eosinophils Absolute: 0.1 10*3/uL (ref 0.0–0.7)
Eosinophils Relative: 2.4 % (ref 0.0–5.0)
HCT: 39.9 % (ref 36.0–46.0)
Hemoglobin: 12.9 g/dL (ref 12.0–15.0)
Lymphocytes Relative: 30.7 % (ref 12.0–46.0)
Lymphs Abs: 1.6 10*3/uL (ref 0.7–4.0)
MCHC: 32.3 g/dL (ref 30.0–36.0)
MCV: 95.2 fl (ref 78.0–100.0)
Monocytes Absolute: 0.4 10*3/uL (ref 0.1–1.0)
Monocytes Relative: 8.9 % (ref 3.0–12.0)
Neutro Abs: 2.9 10*3/uL (ref 1.4–7.7)
Neutrophils Relative %: 57.5 % (ref 43.0–77.0)
Platelets: 263 10*3/uL (ref 150.0–400.0)
RBC: 4.2 Mil/uL (ref 3.87–5.11)
RDW: 12.5 % (ref 11.5–15.5)
WBC: 5.1 10*3/uL (ref 4.0–10.5)

## 2023-06-13 LAB — COMPREHENSIVE METABOLIC PANEL
ALT: 13 U/L (ref 0–35)
AST: 14 U/L (ref 0–37)
Albumin: 4.1 g/dL (ref 3.5–5.2)
Alkaline Phosphatase: 54 U/L (ref 39–117)
BUN: 10 mg/dL (ref 6–23)
CO2: 26 meq/L (ref 19–32)
Calcium: 9.1 mg/dL (ref 8.4–10.5)
Chloride: 99 meq/L (ref 96–112)
Creatinine, Ser: 0.64 mg/dL (ref 0.40–1.20)
GFR: 105.57 mL/min (ref >60.00)
Glucose, Bld: 71 mg/dL (ref 70–99)
Potassium: 3.8 meq/L (ref 3.5–5.1)
Sodium: 133 meq/L — ABNORMAL LOW (ref 135–145)
Total Bilirubin: 0.5 mg/dL (ref 0.2–1.2)
Total Protein: 7.1 g/dL (ref 6.0–8.3)

## 2023-06-13 LAB — LIPID PANEL
Cholesterol: 165 mg/dL (ref 0–200)
HDL: 48.1 mg/dL (ref >39.00)
LDL Cholesterol: 105 mg/dL — ABNORMAL HIGH (ref 0–99)
NonHDL: 117.15
Total CHOL/HDL Ratio: 3
Triglycerides: 62 mg/dL (ref 0.0–149.0)
VLDL: 12.4 mg/dL (ref 0.0–40.0)

## 2023-06-13 LAB — TSH: TSH: 0.76 u[IU]/mL (ref 0.35–5.50)

## 2023-06-13 NOTE — Progress Notes (Signed)
Patient ID: Victoria Gilmore, female    DOB: [birth year], 47 y.o.   MRN: [id number]  This visit was conducted in person.  BP 124/79 (BP Location: Left Arm, Patient Position: Sitting, Cuff Size: Normal) Comment: Office Automatic BP Cuff  Pulse 74   Temp 97.8 F (36.6 C) (Temporal)   Ht 5\' 7"  (1.702 m)   Wt 192 lb 8 oz (87.3 kg)   LMP 05/29/2023   SpO2 100%   BMI 30.15 kg/m    CC:  Chief Complaint  Patient presents with   Hypertension    Dentist checked BP with wrist monitor on Tuesday and was told it was elevated   Dizziness    Subjective:   HPI: Victoria Gilmore is a 47 y.o. female presenting on 06/13/2023 for Hypertension (Dentist checked BP with wrist monitor on Tuesday and was told it was elevated) and Dizziness  Previous patient of Dr. Elmyra Ricks presents to have blood pressure reevaluated. At dentist earlier this week she was told it was elevated.  This was with a wrist monitor. 142/92 She has noted some dizziness.  At school nurse check 152/80. She has no personal history of high blood pressure Family history: HTN    She has not been feeling great since she had a sinus infection in June... has been using allergy medication, cetrizine.  Has been feeling off balance, occ vertigo. Has been feeling better drinking more water.   No  exertional CP ( occ muscular chest pain when waking up), NO SOB, no edema  BP Readings from Last 3 Encounters:  06/13/23 124/79  05/10/22 110/70  02/08/22 108/78    Wt Readings from Last 3 Encounters:  06/13/23 192 lb 8 oz (87.3 kg)  05/10/22 193 lb 4 oz (87.7 kg)  02/08/22 186 lb 8 oz (84.6 kg)  Body mass index is 30.15 kg/m.   HAS been eating out more lately. Some weight gain. She works as an Optician, dispensing.     Relevant past medical, surgical, family and social history reviewed and updated as indicated. Interim medical history since our last visit reviewed. Allergies and medications reviewed and updated. Outpatient  Medications Prior to Visit  Medication Sig Dispense Refill   ALPRAZolam (XANAX) 0.5 MG tablet TAKE ONE TABLET BY MOUTH AT BEDTIME AS NEEDED FOR ANXIETY 30 tablet 0   cetirizine (ZYRTEC) 10 MG tablet Take 10 mg by mouth as needed for allergies. Reported on 02/14/2016     Cholecalciferol (VITAMIN D) 125 MCG (5000 UT) CAPS Take 1 tablet by mouth daily.     ibuprofen (ADVIL,MOTRIN) 200 MG tablet Take 400 mg by mouth every 6 (six) hours as needed for moderate pain.     pantoprazole (PROTONIX) 40 MG tablet Take 40 mg by mouth daily as needed.     propranolol (INDERAL) 10 MG tablet Take 1 tablet (10 mg total) by mouth 3 (three) times daily as needed (30 minutes prior to driving or anxiety event). (Patient not taking: Reported on 06/13/2023) 30 tablet 0   Lidocaine-Hydrocortisone Ace (LIDOCAINE-HYDROCORT, PERIANAL,) 3-0.5 % CREA Apply 1 application topically 2 (two) times daily as needed. 30 g 0   Vitamin D, Ergocalciferol, (DRISDOL) 1.25 MG (50000 UNIT) CAPS capsule Take 1 capsule (50,000 Units total) by mouth every 7 (seven) days. 4 capsule 1   No facility-administered medications prior to visit.     Per HPI unless specifically indicated in ROS section below Review of Systems  Constitutional:  Negative for fatigue and fever.  HENT:  Negative for congestion.   Eyes:  Negative for pain.  Respiratory:  Negative for cough and shortness of breath.   Cardiovascular:  Negative for chest pain, palpitations and leg swelling.  Gastrointestinal:  Negative for abdominal pain.  Genitourinary:  Negative for dysuria and vaginal bleeding.  Musculoskeletal:  Negative for back pain.  Neurological:  Negative for syncope, light-headedness and headaches.  Psychiatric/Behavioral:  Negative for dysphoric mood.    Objective:  BP 124/79 (BP Location: Left Arm, Patient Position: Sitting, Cuff Size: Normal) Comment: Office Automatic BP Cuff  Pulse 74   Temp 97.8 F (36.6 C) (Temporal)   Ht 5\' 7"  (1.702 m)   Wt 192 lb  8 oz (87.3 kg)   LMP 05/29/2023   SpO2 100%   BMI 30.15 kg/m   Wt Readings from Last 3 Encounters:  06/13/23 192 lb 8 oz (87.3 kg)  05/10/22 193 lb 4 oz (87.7 kg)  02/08/22 186 lb 8 oz (84.6 kg)      Physical Exam Constitutional:      General: She is not in acute distress.    Appearance: Normal appearance. She is well-developed. She is not ill-appearing or toxic-appearing.  HENT:     Head: Normocephalic.     Right Ear: Hearing, tympanic membrane, ear canal and external ear normal. Tympanic membrane is not erythematous, retracted or bulging.     Left Ear: Hearing, tympanic membrane, ear canal and external ear normal. Tympanic membrane is not erythematous, retracted or bulging.     Nose: No mucosal edema or rhinorrhea.     Right Sinus: No maxillary sinus tenderness or frontal sinus tenderness.     Left Sinus: No maxillary sinus tenderness or frontal sinus tenderness.     Mouth/Throat:     Mouth: Oropharynx is clear and moist and mucous membranes are normal.     Pharynx: Uvula midline.  Eyes:     General: Lids are normal. Lids are everted, no foreign bodies appreciated.     Extraocular Movements: EOM normal.     Conjunctiva/sclera: Conjunctivae normal.     Pupils: Pupils are equal, round, and reactive to light.  Neck:     Thyroid: No thyroid mass or thyromegaly.     Vascular: No carotid bruit.     Trachea: Trachea normal.  Cardiovascular:     Rate and Rhythm: Normal rate and regular rhythm.     Pulses: Normal pulses.     Heart sounds: Normal heart sounds, S1 normal and S2 normal. No murmur heard.    No friction rub. No gallop.  Pulmonary:     Effort: Pulmonary effort is normal. No tachypnea or respiratory distress.     Breath sounds: Normal breath sounds. No decreased breath sounds, wheezing, rhonchi or rales.  Abdominal:     General: Bowel sounds are normal.     Palpations: Abdomen is soft.     Tenderness: There is no abdominal tenderness.  Musculoskeletal:     Cervical  back: Normal range of motion and neck supple.  Skin:    General: Skin is warm, dry and intact.     Findings: No rash.  Neurological:     Mental Status: She is alert.  Psychiatric:        Mood and Affect: Mood is not anxious or depressed.        Speech: Speech normal.        Behavior: Behavior normal. Behavior is cooperative.        Thought Content: Thought content normal.  Cognition and Memory: Cognition and memory normal.        Judgment: Judgment normal.       Results for orders placed or performed in visit on 06/13/23  CBC with Differential/Platelet  Result Value Ref Range   WBC 5.1 4.0 - 10.5 K/uL   RBC 4.20 3.87 - 5.11 Mil/uL   Hemoglobin 12.9 12.0 - 15.0 g/dL   HCT 14.7 82.9 - 56.2 %   MCV 95.2 78.0 - 100.0 fl   MCHC 32.3 30.0 - 36.0 g/dL   RDW 13.0 86.5 - 78.4 %   Platelets 263.0 150.0 - 400.0 K/uL   Neutrophils Relative % 57.5 43.0 - 77.0 %   Lymphocytes Relative 30.7 12.0 - 46.0 %   Monocytes Relative 8.9 3.0 - 12.0 %   Eosinophils Relative 2.4 0.0 - 5.0 %   Basophils Relative 0.5 0.0 - 3.0 %   Neutro Abs 2.9 1.4 - 7.7 K/uL   Lymphs Abs 1.6 0.7 - 4.0 K/uL   Monocytes Absolute 0.4 0.1 - 1.0 K/uL   Eosinophils Absolute 0.1 0.0 - 0.7 K/uL   Basophils Absolute 0.0 0.0 - 0.1 K/uL  Lipid panel  Result Value Ref Range   Cholesterol 165 0 - 200 mg/dL   Triglycerides 69.6 0.0 - 149.0 mg/dL   HDL 29.52 >84.13 mg/dL   VLDL 24.4 0.0 - 01.0 mg/dL   LDL Cholesterol 272 (H) 0 - 99 mg/dL   Total CHOL/HDL Ratio 3    NonHDL 117.15   Comprehensive metabolic panel  Result Value Ref Range   Sodium 133 (L) 135 - 145 mEq/L   Potassium 3.8 3.5 - 5.1 mEq/L   Chloride 99 96 - 112 mEq/L   CO2 26 19 - 32 mEq/L   Glucose, Bld 71 70 - 99 mg/dL   BUN 10 6 - 23 mg/dL   Creatinine, Ser 5.36 0.40 - 1.20 mg/dL   Total Bilirubin 0.5 0.2 - 1.2 mg/dL   Alkaline Phosphatase 54 39 - 117 U/L   AST 14 0 - 37 U/L   ALT 13 0 - 35 U/L   Total Protein 7.1 6.0 - 8.3 g/dL   Albumin 4.1 3.5  - 5.2 g/dL   GFR 644.03 >47.42 mL/min   Calcium 9.1 8.4 - 10.5 mg/dL  TSH  Result Value Ref Range   TSH 0.76 0.35 - 5.50 uIU/mL  Hemoglobin A1c  Result Value Ref Range   Hgb A1c MFr Bld 5.3 4.6 - 6.5 %    Assessment and Plan  Elevated BP without diagnosis of hypertension Assessment & Plan: Acute, will evaluate with labs for secondary causes and possible endorgan damage.4  Get BP cuff. Goal BP < 150/90, call if remaining above goal.  Decrease salt and eating out.  Work on weight loss as able.  Start regular exercise, goal 150 min per week.  Orders: -     CBC with Differential/Platelet -     Lipid panel -     Comprehensive metabolic panel -     TSH  Screening for diabetes mellitus -     Hemoglobin A1c    Return in about 6 weeks (around 07/25/2023) for  TOC with Dr. Paula Libra, MD

## 2023-06-13 NOTE — Patient Instructions (Addendum)
Get BP cuff. Goal BP < 150/90, call if remaining above goal.  Decrease salt and eating out.  Work on weight loss as able.  Start regular exercise, goal 150 min per week.

## 2023-06-16 LAB — HEMOGLOBIN A1C: Hgb A1c MFr Bld: 5.3 % (ref 4.6–6.5)

## 2023-06-17 NOTE — Progress Notes (Signed)
No critical labs need to be addressed urgently. We will discuss labs in detail at upcoming office visit.   

## 2023-07-04 DIAGNOSIS — R030 Elevated blood-pressure reading, without diagnosis of hypertension: Secondary | ICD-10-CM | POA: Insufficient documentation

## 2023-07-04 NOTE — Assessment & Plan Note (Signed)
Acute, will evaluate with labs for secondary causes and possible endorgan damage.4  Get BP cuff. Goal BP < 150/90, call if remaining above goal.  Decrease salt and eating out.  Work on weight loss as able.  Start regular exercise, goal 150 min per week.

## 2023-07-25 ENCOUNTER — Ambulatory Visit: Payer: BC Managed Care – PPO | Admitting: Family Medicine

## 2023-08-15 ENCOUNTER — Encounter: Payer: Self-pay | Admitting: Family Medicine

## 2023-08-15 ENCOUNTER — Ambulatory Visit (INDEPENDENT_AMBULATORY_CARE_PROVIDER_SITE_OTHER): Payer: BC Managed Care – PPO | Admitting: Family Medicine

## 2023-08-15 VITALS — BP 110/70 | HR 81 | Temp 98.7°F | Ht 67.0 in | Wt 191.1 lb

## 2023-08-15 DIAGNOSIS — F410 Panic disorder [episodic paroxysmal anxiety] without agoraphobia: Secondary | ICD-10-CM | POA: Diagnosis not present

## 2023-08-15 DIAGNOSIS — R0683 Snoring: Secondary | ICD-10-CM | POA: Insufficient documentation

## 2023-08-15 DIAGNOSIS — N926 Irregular menstruation, unspecified: Secondary | ICD-10-CM | POA: Insufficient documentation

## 2023-08-15 DIAGNOSIS — F419 Anxiety disorder, unspecified: Secondary | ICD-10-CM

## 2023-08-15 DIAGNOSIS — H8110 Benign paroxysmal vertigo, unspecified ear: Secondary | ICD-10-CM | POA: Diagnosis not present

## 2023-08-15 DIAGNOSIS — K219 Gastro-esophageal reflux disease without esophagitis: Secondary | ICD-10-CM

## 2023-08-15 MED ORDER — ALPRAZOLAM 0.5 MG PO TABS: ORAL_TABLET | ORAL | 0 refills | Status: AC

## 2023-08-15 NOTE — Progress Notes (Signed)
Patient ID: Victoria Gilmore, female    DOB: 1976-08-30, 47 y.o.   MRN: [id number]  This visit was conducted in person.  BP 110/70 (BP Location: Left Arm, Patient Position: Sitting, Cuff Size: Large)   Pulse 81   Temp 98.7 F (37.1 C) (Temporal)   Ht 5\' 7"  (1.702 m)   Wt 191 lb 2 oz (86.7 kg)   LMP 07/16/2023   SpO2 97%   BMI 29.93 kg/m    CC:  Chief Complaint  Patient presents with   Establish Care    TOC from Dr. Selena Batten    Subjective:   HPI: Victoria Gilmore is a 47 y.o. female presenting on 08/15/2023 for Establish Care (TOC from Dr. Selena Batten)  Previous PCP: Selena Batten Last complete physical May 10, 2022  No GYN.  She would like hormone testing given some menses irregularity.  Mom went through menopause late 45s.  Occ possible hot flashes.   BP at work has been well controlled. BP Readings from Last 3 Encounters:  08/15/23 110/70  06/13/23 124/79  05/10/22 110/70   Diet: heart healthy diet. Exercise: minimal   Panic disorder: using alprazolam rarely.  She has noted snoring but no daytime fatigue, she is not falling asleep frequently during the day.  Dentist noted possible small oropharynx and increased risk of sleep apnea and suggested testing.  Relevant past medical, surgical, family and social history reviewed and updated as indicated. Interim medical history since our last visit reviewed. Allergies and medications reviewed and updated. Outpatient Medications Prior to Visit  Medication Sig Dispense Refill   cetirizine (ZYRTEC) 10 MG tablet Take 10 mg by mouth as needed for allergies. Reported on 02/14/2016     Cholecalciferol (VITAMIN D) 125 MCG (5000 UT) CAPS Take 1 tablet by mouth daily.     ibuprofen (ADVIL,MOTRIN) 200 MG tablet Take 400 mg by mouth every 6 (six) hours as needed for moderate pain.     ALPRAZolam (XANAX) 0.5 MG tablet TAKE ONE TABLET BY MOUTH AT BEDTIME AS NEEDED FOR ANXIETY 30 tablet 0   pantoprazole (PROTONIX) 40 MG tablet Take 40 mg by  mouth daily as needed.     propranolol (INDERAL) 10 MG tablet Take 1 tablet (10 mg total) by mouth 3 (three) times daily as needed (30 minutes prior to driving or anxiety event). (Patient not taking: Reported on 06/13/2023) 30 tablet 0   No facility-administered medications prior to visit.     Per HPI unless specifically indicated in ROS section below Review of Systems  Constitutional:  Negative for fatigue and fever.  HENT:  Negative for congestion.   Eyes:  Negative for pain.  Respiratory:  Negative for cough and shortness of breath.   Cardiovascular:  Negative for chest pain, palpitations and leg swelling.  Gastrointestinal:  Negative for abdominal pain.  Genitourinary:  Negative for dysuria and vaginal bleeding.  Musculoskeletal:  Negative for back pain.  Neurological:  Negative for syncope, light-headedness and headaches.  Psychiatric/Behavioral:  Negative for dysphoric mood.    Objective:  BP 110/70 (BP Location: Left Arm, Patient Position: Sitting, Cuff Size: Large)   Pulse 81   Temp 98.7 F (37.1 C) (Temporal)   Ht 5\' 7"  (1.702 m)   Wt 191 lb 2 oz (86.7 kg)   LMP 07/16/2023   SpO2 97%   BMI 29.93 kg/m   Wt Readings from Last 3 Encounters:  08/15/23 191 lb 2 oz (86.7 kg)  06/13/23 192 lb 8 oz (87.3 kg)  05/10/22 193 lb 4 oz (87.7 kg)      Physical Exam Constitutional:      General: She is not in acute distress.    Appearance: Normal appearance. She is well-developed. She is not ill-appearing or toxic-appearing.  HENT:     Head: Normocephalic.     Right Ear: Hearing, tympanic membrane, ear canal and external ear normal. Tympanic membrane is not erythematous, retracted or bulging.     Left Ear: Hearing, tympanic membrane, ear canal and external ear normal. Tympanic membrane is not erythematous, retracted or bulging.     Nose: No mucosal edema or rhinorrhea.     Right Sinus: No maxillary sinus tenderness or frontal sinus tenderness.     Left Sinus: No maxillary sinus  tenderness or frontal sinus tenderness.     Mouth/Throat:     Mouth: Oropharynx is clear and moist and mucous membranes are normal.     Pharynx: Uvula midline.  Eyes:     General: Lids are normal. Lids are everted, no foreign bodies appreciated.     Extraocular Movements: EOM normal.     Conjunctiva/sclera: Conjunctivae normal.     Pupils: Pupils are equal, round, and reactive to light.  Neck:     Thyroid: No thyroid mass or thyromegaly.     Vascular: No carotid bruit.     Trachea: Trachea normal.  Cardiovascular:     Rate and Rhythm: Normal rate and regular rhythm.     Pulses: Normal pulses.     Heart sounds: Normal heart sounds, S1 normal and S2 normal. No murmur heard.    No friction rub. No gallop.  Pulmonary:     Effort: Pulmonary effort is normal. No tachypnea or respiratory distress.     Breath sounds: Normal breath sounds. No decreased breath sounds, wheezing, rhonchi or rales.  Abdominal:     General: Bowel sounds are normal.     Palpations: Abdomen is soft.     Tenderness: There is no abdominal tenderness.  Musculoskeletal:     Cervical back: Normal range of motion and neck supple.  Skin:    General: Skin is warm, dry and intact.     Findings: No rash.  Neurological:     Mental Status: She is alert.  Psychiatric:        Mood and Affect: Mood is not anxious or depressed.        Speech: Speech normal.        Behavior: Behavior normal. Behavior is cooperative.        Thought Content: Thought content normal.        Cognition and Memory: Cognition and memory normal.        Judgment: Judgment normal.       Results for orders placed or performed in visit on 06/13/23  CBC with Differential/Platelet  Result Value Ref Range   WBC 5.1 4.0 - 10.5 K/uL   RBC 4.20 3.87 - 5.11 Mil/uL   Hemoglobin 12.9 12.0 - 15.0 g/dL   HCT 25.4 27.0 - 62.3 %   MCV 95.2 78.0 - 100.0 fl   MCHC 32.3 30.0 - 36.0 g/dL   RDW 76.2 83.1 - 51.7 %   Platelets 263.0 150.0 - 400.0 K/uL    Neutrophils Relative % 57.5 43.0 - 77.0 %   Lymphocytes Relative 30.7 12.0 - 46.0 %   Monocytes Relative 8.9 3.0 - 12.0 %   Eosinophils Relative 2.4 0.0 - 5.0 %   Basophils Relative 0.5 0.0 - 3.0 %  Neutro Abs 2.9 1.4 - 7.7 K/uL   Lymphs Abs 1.6 0.7 - 4.0 K/uL   Monocytes Absolute 0.4 0.1 - 1.0 K/uL   Eosinophils Absolute 0.1 0.0 - 0.7 K/uL   Basophils Absolute 0.0 0.0 - 0.1 K/uL  Lipid panel  Result Value Ref Range   Cholesterol 165 0 - 200 mg/dL   Triglycerides 40.9 0.0 - 149.0 mg/dL   HDL 81.19 >14.78 mg/dL   VLDL 29.5 0.0 - 62.1 mg/dL   LDL Cholesterol 308 (H) 0 - 99 mg/dL   Total CHOL/HDL Ratio 3    NonHDL 117.15   Comprehensive metabolic panel  Result Value Ref Range   Sodium 133 (L) 135 - 145 mEq/L   Potassium 3.8 3.5 - 5.1 mEq/L   Chloride 99 96 - 112 mEq/L   CO2 26 19 - 32 mEq/L   Glucose, Bld 71 70 - 99 mg/dL   BUN 10 6 - 23 mg/dL   Creatinine, Ser 6.57 0.40 - 1.20 mg/dL   Total Bilirubin 0.5 0.2 - 1.2 mg/dL   Alkaline Phosphatase 54 39 - 117 U/L   AST 14 0 - 37 U/L   ALT 13 0 - 35 U/L   Total Protein 7.1 6.0 - 8.3 g/dL   Albumin 4.1 3.5 - 5.2 g/dL   GFR 846.96 >29.52 mL/min   Calcium 9.1 8.4 - 10.5 mg/dL  TSH  Result Value Ref Range   TSH 0.76 0.35 - 5.50 uIU/mL  Hemoglobin A1c  Result Value Ref Range   Hgb A1c MFr Bld 5.3 4.6 - 6.5 %    Assessment and Plan  Menstrual irregularity Assessment & Plan: She has noted some increasing irregularity in her menstrual cycles and is likely in the perimenopausal period.  She would like to do hormonal testing to know how these fluctuate during the menstrual cycle.  She will do testing today and she is on her menstrual cycle  Orders: -     Estradiol -     Follicle stimulating hormone -     Luteinizing hormone -     Progesterone  Panic disorder Assessment & Plan:  Chronic, induced with driving.  Uses behavioral treatment.  Uses alprazolam prn rarely.  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   Snoring Assessment & Plan: Chronic, she notes no other symptoms of sleep apnea.  Blood pressure is well-controlled and no daytime fatigue.  She will let me know if her symptoms change for consideration of possible sleep testing.     Return in about 6 weeks (around 09/26/2023) for annual physical non labs prior, pt request pap.   Kerby Nora, MD

## 2023-08-15 NOTE — Assessment & Plan Note (Signed)
Chronic intermittent

## 2023-08-15 NOTE — Assessment & Plan Note (Signed)
She has noted some increasing irregularity in her menstrual cycles and is likely in the perimenopausal period.  She would like to do hormonal testing to know how these fluctuate during the menstrual cycle.  She will do testing today and she is on her menstrual cycle

## 2023-08-15 NOTE — Assessment & Plan Note (Signed)
Chronic, she notes no other symptoms of sleep apnea.  Blood pressure is well-controlled and no daytime fatigue.  She will let me know if her symptoms change for consideration of possible sleep testing.

## 2023-08-15 NOTE — Assessment & Plan Note (Addendum)
Chronic, induced with driving.  Uses behavioral treatment.  Uses alprazolam prn rarely.

## 2023-08-15 NOTE — Patient Instructions (Addendum)
Please stop at the lab to have labs drawn.  Get back walking 150 min per week.

## 2023-08-15 NOTE — Assessment & Plan Note (Signed)
Chronic, uses antacid prn.

## 2023-08-16 LAB — PROGESTERONE: Progesterone: 7.1 ng/mL

## 2023-08-16 LAB — ESTRADIOL: Estradiol: 99 pg/mL

## 2023-08-16 LAB — LUTEINIZING HORMONE: LH: 2.6 m[IU]/mL

## 2023-08-16 LAB — FOLLICLE STIMULATING HORMONE: FSH: 3.3 m[IU]/mL

## 2023-09-30 ENCOUNTER — Encounter: Payer: BC Managed Care – PPO | Admitting: Family Medicine

## 2023-10-17 ENCOUNTER — Encounter: Payer: Self-pay | Admitting: Family Medicine

## 2023-10-17 ENCOUNTER — Ambulatory Visit (INDEPENDENT_AMBULATORY_CARE_PROVIDER_SITE_OTHER): Payer: BC Managed Care – PPO | Admitting: Family Medicine

## 2023-10-17 VITALS — BP 109/72 | HR 82 | Temp 98.7°F | Ht 67.0 in | Wt 190.6 lb

## 2023-10-17 DIAGNOSIS — H109 Unspecified conjunctivitis: Secondary | ICD-10-CM | POA: Diagnosis not present

## 2023-10-17 MED ORDER — POLYMYXIN B-TRIMETHOPRIM 10000-0.1 UNIT/ML-% OP SOLN
1.0000 [drp] | Freq: Four times a day (QID) | OPHTHALMIC | 0 refills | Status: AC
Start: 2023-10-17 — End: 2023-10-24

## 2023-10-17 NOTE — Progress Notes (Signed)
R>L eye sx. Started in R eye.  Started 5 days ago.  Wears glasses.  Works in a school.  Voice is a little scratchy.    Had a ST about 3 week ago with some AM sputum that would improve during the day.  Lost her voice  last week but that improved some in the meantime.    No vision loss.  Eye discharge in the AM.  No eye pain but eyes feel irritated.    No fevers, chills, vomiting. No sputum now.    Meds, vitals, and allergies reviewed.   ROS: Per HPI unless specifically indicated in ROS section   Nad Ncat TM w/o erythema, small amount of wax in the R ear canal R>L conjunctival injection.  PERRL EOMI MMM OP wnl Neck supple, no LA Rrr Ctab W/o edema.

## 2023-10-17 NOTE — Assessment & Plan Note (Signed)
Presumed to be bacterial, start polytrim, routine cautions given to patient.  See AVS.  She agrees with plan.

## 2023-10-17 NOTE — Patient Instructions (Signed)
Try using warm compresses in the AM, wash your hands frequently and use the drops in the meantime.  Thank you for your effort.  Take care.  Glad to see you.

## 2023-10-31 ENCOUNTER — Encounter: Payer: BC Managed Care – PPO | Admitting: Family Medicine

## 2023-11-14 ENCOUNTER — Encounter: Payer: 59 | Admitting: Family Medicine

## 2023-11-27 ENCOUNTER — Ambulatory Visit (INDEPENDENT_AMBULATORY_CARE_PROVIDER_SITE_OTHER): Payer: 59 | Admitting: Family Medicine

## 2023-11-27 ENCOUNTER — Encounter: Payer: Self-pay | Admitting: Family Medicine

## 2023-11-27 ENCOUNTER — Other Ambulatory Visit (HOSPITAL_COMMUNITY)
Admission: RE | Admit: 2023-11-27 | Discharge: 2023-11-27 | Disposition: A | Discharge status: Home / Self Care | Payer: 59 | Source: Ambulatory Visit | Attending: Family Medicine | Admitting: Family Medicine

## 2023-11-27 VITALS — BP 116/78 | HR 79 | Temp 97.7°F | Ht 67.0 in | Wt 193.0 lb

## 2023-11-27 DIAGNOSIS — Z1211 Encounter for screening for malignant neoplasm of colon: Secondary | ICD-10-CM

## 2023-11-27 DIAGNOSIS — E559 Vitamin D deficiency, unspecified: Secondary | ICD-10-CM

## 2023-11-27 DIAGNOSIS — F410 Panic disorder [episodic paroxysmal anxiety] without agoraphobia: Secondary | ICD-10-CM

## 2023-11-27 DIAGNOSIS — Z0000 Encounter for general adult medical examination without abnormal findings: Secondary | ICD-10-CM | POA: Diagnosis not present

## 2023-11-27 DIAGNOSIS — E7800 Pure hypercholesterolemia, unspecified: Secondary | ICD-10-CM | POA: Diagnosis not present

## 2023-11-27 DIAGNOSIS — E871 Hypo-osmolality and hyponatremia: Secondary | ICD-10-CM | POA: Diagnosis not present

## 2023-11-27 DIAGNOSIS — Z124 Encounter for screening for malignant neoplasm of cervix: Secondary | ICD-10-CM | POA: Diagnosis present

## 2023-11-27 LAB — BASIC METABOLIC PANEL
BUN: 14 mg/dL (ref 6–23)
CO2: 27 meq/L (ref 19–32)
Calcium: 9.2 mg/dL (ref 8.4–10.5)
Chloride: 105 meq/L (ref 96–112)
Creatinine, Ser: 0.68 mg/dL (ref 0.40–1.20)
GFR: 103.71 mL/min (ref >60.00)
Glucose, Bld: 81 mg/dL (ref 70–99)
Potassium: 4.6 meq/L (ref 3.5–5.1)
Sodium: 140 meq/L (ref 135–145)

## 2023-11-27 LAB — VITAMIN D 25 HYDROXY (VIT D DEFICIENCY, FRACTURES): VITD: 34.13 ng/mL (ref 30.00–100.00)

## 2023-11-27 NOTE — Assessment & Plan Note (Signed)
Chronic, induced with driving.  Uses behavioral treatment.  Uses alprazolam prn rarely.

## 2023-11-27 NOTE — Assessment & Plan Note (Addendum)
Borderline high cholesterol , Encouraged exercise, weight loss, healthy eating habits.   The 10-year ASCVD risk score (Arnett DK, et al., 2019) is: 0.9%   Values used to calculate the score:     Age: 48 years     Sex: Female     Is Non-Hispanic African American: Yes     Diabetic: No     Tobacco smoker: No     Systolic Blood Pressure: 116 mmHg     Is BP treated: No     HDL Cholesterol: 48.1 mg/dL     Total Cholesterol: 165 mg/dL

## 2023-11-27 NOTE — Patient Instructions (Addendum)
Please stop at the lab to have labs drawn. ? ?  ?Please call the location of your choice from the menu below to schedule your Mammogram and/or Bone Density appointment.   ? ?[place]  ? ?[facility]                ?      Phone:  [phone] ?[address]                               ?[place], [place] [address]                                                             ?Services: Traditional and 3D Mammogram, Bone Density  ? ?[facility]           ?      Phone: [phone] ?[address]                                                       ?[place], [place] [address]    ?Service: Bone Density ONLY  ? *this site does NOT perform mammograms ? ?[facility]                       ? Phone:  [phone] ?[address]                                  ?[place], [place] [address]                                            ?Services:  3D Mammogram and Bone Density  ? ? ? ?

## 2023-11-27 NOTE — Progress Notes (Signed)
Patient ID: Victoria Gilmore, female    DOB: 11-Dec-1975, 48 y.o.   MRN: [id number]  This visit was conducted in person.  BP 116/78 (BP Location: Right Arm, Patient Position: Sitting, Cuff Size: Large)   Pulse 79   Temp 97.7 F (36.5 C) (Temporal)   Ht 5\' 7"  (1.702 m)   Wt 193 lb (87.5 kg)   LMP 11/13/2023   SpO2 96%   BMI 30.23 kg/m    CC:  Chief Complaint  Patient presents with   Annual Exam    Subjective:   HPI: Victoria Gilmore is a 48 y.o. female presenting on 11/27/2023 for Annual Exam  The patient presents for complete physical and review of chronic health problems. He/She also has the following acute concerns today: none   Hyponatremia.Marland Kitchen due for rechceck.  Slight elevation of cholesterol. Lab Results  Component Value Date   CHOL 165 06/13/2023   HDL 48.10 06/13/2023   LDLCALC 105 (H) 06/13/2023   LDLDIRECT 89.4 04/10/2011   TRIG 62.0 06/13/2023   CHOLHDL 3 06/13/2023      BP at work has been well controlled. BP Readings from Last 3 Encounters:  11/27/23 116/78  10/17/23 109/72  08/15/23 110/70   Diet: heart healthy diet... she has trouble with eating more sweets Exercise: minimal.. but has joined a gym   Panic disorder: using alprazolam rarely.   Relevant past medical, surgical, family and social history reviewed and updated as indicated. Interim medical history since our last visit reviewed. Allergies and medications reviewed and updated. Outpatient Medications Prior to Visit  Medication Sig Dispense Refill   ALPRAZolam (XANAX) 0.5 MG tablet TAKE ONE TABLET BY MOUTH AT BEDTIME AS NEEDED FOR ANXIETY 30 tablet 0   cetirizine (ZYRTEC) 10 MG tablet Take 10 mg by mouth as needed for allergies. Reported on 02/14/2016     Cholecalciferol (VITAMIN D) 125 MCG (5000 UT) CAPS Take 1 tablet by mouth daily.     ibuprofen (ADVIL,MOTRIN) 200 MG tablet Take 400 mg by mouth every 6 (six) hours as needed for moderate pain.     No facility-administered  medications prior to visit.     Per HPI unless specifically indicated in ROS section below Review of Systems  Constitutional:  Negative for fatigue and fever.  HENT:  Negative for congestion.   Eyes:  Negative for pain.  Respiratory:  Negative for cough and shortness of breath.   Cardiovascular:  Negative for chest pain, palpitations and leg swelling.  Gastrointestinal:  Negative for abdominal pain.  Genitourinary:  Negative for dysuria and vaginal bleeding.  Musculoskeletal:  Negative for back pain.  Neurological:  Negative for syncope, light-headedness and headaches.  Psychiatric/Behavioral:  Negative for dysphoric mood.    Objective:  BP 116/78 (BP Location: Right Arm, Patient Position: Sitting, Cuff Size: Large)   Pulse 79   Temp 97.7 F (36.5 C) (Temporal)   Ht 5\' 7"  (1.702 m)   Wt 193 lb (87.5 kg)   LMP 11/13/2023   SpO2 96%   BMI 30.23 kg/m   Wt Readings from Last 3 Encounters:  11/27/23 193 lb (87.5 kg)  10/17/23 190 lb 9.6 oz (86.5 kg)  08/15/23 191 lb 2 oz (86.7 kg)      Physical Exam Constitutional:      General: She is not in acute distress.    Appearance: Normal appearance. She is well-developed. She is not ill-appearing or toxic-appearing.  HENT:     Head: Normocephalic.  Right Ear: Hearing, tympanic membrane, ear canal and external ear normal. Tympanic membrane is not erythematous, retracted or bulging.     Left Ear: Hearing, tympanic membrane, ear canal and external ear normal. Tympanic membrane is not erythematous, retracted or bulging.     Nose: No mucosal edema or rhinorrhea.     Right Sinus: No maxillary sinus tenderness or frontal sinus tenderness.     Left Sinus: No maxillary sinus tenderness or frontal sinus tenderness.     Mouth/Throat:     Pharynx: Uvula midline.  Eyes:     General: Lids are normal. Lids are everted, no foreign bodies appreciated.     Conjunctiva/sclera: Conjunctivae normal.     Pupils: Pupils are equal, round, and reactive  to light.  Neck:     Thyroid: No thyroid mass or thyromegaly.     Vascular: No carotid bruit.     Trachea: Trachea normal.  Cardiovascular:     Rate and Rhythm: Normal rate and regular rhythm.     Pulses: Normal pulses.     Heart sounds: Normal heart sounds, S1 normal and S2 normal. No murmur heard.    No friction rub. No gallop.  Pulmonary:     Effort: Pulmonary effort is normal. No tachypnea or respiratory distress.     Breath sounds: Normal breath sounds. No decreased breath sounds, wheezing, rhonchi or rales.  Abdominal:     General: Bowel sounds are normal.     Palpations: Abdomen is soft.     Tenderness: There is no abdominal tenderness.  Musculoskeletal:     Cervical back: Normal range of motion and neck supple.  Skin:    General: Skin is warm and dry.     Findings: No rash.  Neurological:     Mental Status: She is alert.  Psychiatric:        Mood and Affect: Mood is not anxious or depressed.        Speech: Speech normal.        Behavior: Behavior normal. Behavior is cooperative.        Thought Content: Thought content normal.        Judgment: Judgment normal.       Results for orders placed or performed in visit on 08/15/23  Estradiol   Collection Time: 08/15/23  4:16 PM  Result Value Ref Range   Estradiol 99 pg/mL  Follicle stimulating hormone   Collection Time: 08/15/23  4:16 PM  Result Value Ref Range   FSH 3.3 mIU/mL  Luteinizing hormone   Collection Time: 08/15/23  4:16 PM  Result Value Ref Range   LH 2.6 mIU/mL  Progesterone   Collection Time: 08/15/23  4:16 PM  Result Value Ref Range   Progesterone 7.1 ng/mL    Assessment and Plan The patient's preventative maintenance and recommended screening tests for an annual wellness exam were reviewed in full today. Brought up to date unless services declined.  Counselled on the importance of diet, exercise, and its role in overall health and mortality. The patient's FH and SH was reviewed, including  their home life, tobacco status, and drug and alcohol status.   Vaccines: uptodate with Tdap, refused flu Pap/DVE:  nml pap 03/03/2020 ,  pt request repeat today. Mammo: 04/2022 Bone Density:not indicated Colon:  due.. referral paced Smoking Status: none ETOH/ drug use: none/none  Hep C:  done  HIV screen:   done  Routine general medical examination at a health care facility  Hypercholesteremia Assessment & Plan: Borderline  high cholesterol , Encouraged exercise, weight loss, healthy eating habits.   The 10-year ASCVD risk score (Arnett DK, et al., 2019) is: 0.9%   Values used to calculate the score:     Age: 70 years     Sex: Female     Is Non-Hispanic African American: Yes     Diabetic: No     Tobacco smoker: No     Systolic Blood Pressure: 116 mmHg     Is BP treated: No     HDL Cholesterol: 48.1 mg/dL     Total Cholesterol: 165 mg/dL    Panic disorder Assessment & Plan:  Chronic, induced with driving.  Uses behavioral treatment.  Uses alprazolam prn rarely.   Vitamin D deficiency Assessment & Plan: Due for reevaluation  Orders: -     VITAMIN D 25 Hydroxy (Vit-D Deficiency, Fractures)  Colon cancer screening -     Ambulatory referral to Gastroenterology  Low sodium levels -     Basic metabolic panel  Cervical cancer screening -     Cytology - PAP     Return in about 1 year (around 11/26/2024) for annual physical with fasting labs prior.   Kerby Nora, MD

## 2023-11-27 NOTE — Assessment & Plan Note (Signed)
Due for reevaluation

## 2023-12-03 LAB — CYTOLOGY - PAP
Comment: NEGATIVE
Diagnosis: NEGATIVE
Diagnosis: REACTIVE
High risk HPV: NEGATIVE

## 2024-05-11 ENCOUNTER — Ambulatory Visit: Payer: Self-pay

## 2024-05-11 NOTE — Telephone Encounter (Signed)
 FYI Only or Action Required?: FYI only for provider.  Patient was last seen in primary care on 11/27/2023 by Avelina Greig BRAVO, MD.  Called Nurse Triage reporting Otalgia.  Symptoms began today.  Interventions attempted: Nothing.  Symptoms are: gradually worsening.  Triage Disposition: No disposition on file.  Patient/caregiver understands and will follow disposition?:   Second call today; wants to be seen sooner; no apt available until the 17th.  Copied from CRM [id number]. Topic: Clinical - Red Word Triage >> May 11, 2024  2:15 PM [place] E wrote: [facility] that prompted transfer to Nurse Triage: Patient very congested, going on for 6 days, progressively getting worse. Patient stated it is hard to breath due to being stuffed up, right ear pain as well. Reason for Disposition  [1] SEVERE pain (e.g., excruciating) and [2] not improved 2 hours after pain medicine (e.g., acetaminophen  or ibuprofen)  Answer Assessment - Initial Assessment Questions 1. LOCATION: Which ear is involved?     Right ear 2. ONSET: When did the ear pain start?      today 3. SEVERITY: How bad is the pain?  (Scale 1-10; mild, moderate or severe)     severe 4. URI SYMPTOMS: Do you have a runny nose or cough?     Runny nose, congestion,  5. FEVER: Do you have a fever? If Yes, ask: What is your temperature, how was it measured, and when did it start?     no 6. CAUSE: Have you been swimming recently?, How often do you use Q-TIPS?, Have you had any recent air travel or scuba diving?     Swimming last Friday 7. OTHER SYMPTOMS: Do you have any other symptoms? (e.g., decreased hearing, dizziness, headache, stiff neck, vomiting)     Headache, sinus pain, neck pain 8. PREGNANCY: Is there any chance you are pregnant? When was your last menstrual period?     na  Protocols used: [place]

## 2024-05-11 NOTE — Telephone Encounter (Addendum)
 Video visit scheduled with Dr. Avelina on 05/13/24 but requesting sooner appointment.

## 2024-05-12 ENCOUNTER — Encounter: Payer: Self-pay | Admitting: Family Medicine

## 2024-05-12 ENCOUNTER — Telehealth: Admitting: Family Medicine

## 2024-05-12 VITALS — Ht 67.0 in | Wt 192.0 lb

## 2024-05-12 DIAGNOSIS — J0100 Acute maxillary sinusitis, unspecified: Secondary | ICD-10-CM | POA: Diagnosis not present

## 2024-05-12 MED ORDER — PREDNISONE 10 MG PO TABS
ORAL_TABLET | ORAL | 0 refills | Status: DC
Start: 2024-05-12 — End: 2024-07-29

## 2024-05-12 MED ORDER — AMOXICILLIN 500 MG PO CAPS: 1000.0000 mg | ORAL_CAPSULE | Freq: Two times a day (BID) | ORAL | 0 refills | Status: DC

## 2024-05-12 NOTE — Telephone Encounter (Signed)
 Spoke to pt, relayed Dr. Sherrel message regarding r/s appt to 12:15pm today. Pt states she'd have to check with her job & contact our office back.

## 2024-05-12 NOTE — Progress Notes (Signed)
 VIRTUAL VISIT A virtual visit is felt to be most appropriate for this patient at this time.   I connected with the patient on 05/12/24 at 12:00 PM EDT by virtual telehealth platform and verified that I am speaking with the correct person using two identifiers.   I discussed the limitations, risks, security and privacy concerns of performing an evaluation and management service by  virtual telehealth platform and the availability of in person appointments. I also discussed with the patient that there may be a patient responsible charge related to this service. The patient expressed understanding and agreed to proceed.  Patient location: Home Provider Location: [facility] Participants: Greig Ring and Dusty JINNY Ling   Chief Complaint  Patient presents with   Sinusitis    Ongoing since last Thursday. Has been using nasal spray and decongestion    History of Present Illness:  48 y.o. female patient of Sue Fernicola E, MD presents with  sinus pressure   Date of onset: 6 days Initial symptoms included  runny nose Symptoms progressed to sinus pain and pressure, headache  Having ear pain, right ear. No ST.  Body aches. No cough until slight cough from PND  No fever.  No SOB, no wheeze.  Sick contacts: none COVID testing:   none    She has tried to treat with  nasal steroid and decongestant  Now using nasal saline.     No history of chronic lung disease such as asthma or COPD. Non-smoker.   No antibiotics in last month.   COVID 19 screen No recent travel or known exposure to COVID19 The patient denies respiratory symptoms of COVID 19 at this time.  The importance of social distancing was discussed today.   Review of Systems  Constitutional:  Negative for chills and fever.  HENT:  Positive for congestion, ear pain and sinus pain. Negative for sore throat.   Eyes:  Negative for pain and redness.  Respiratory:  Negative for cough and shortness of breath.    Cardiovascular:  Negative for chest pain, palpitations and leg swelling.  Gastrointestinal:  Negative for abdominal pain, blood in stool, constipation, diarrhea, nausea and vomiting.  Genitourinary:  Negative for dysuria.  Musculoskeletal:  Negative for falls and myalgias.  Skin:  Negative for rash.  Neurological:  Negative for dizziness.  Psychiatric/Behavioral:  Negative for depression. The patient is not nervous/anxious.       Past Medical History:  Diagnosis Date   Anxiety    Breast pain    b/l with menses    GERD (gastroesophageal reflux disease)    Hematuria    per pt neg work up   Low back pain    rad to left leg   Varicose veins of legs    Vitamin D  deficiency     reports that she has never smoked. She has never used smokeless tobacco. She reports current alcohol use. She reports that she does not use drugs.   Current Outpatient Medications:    ALPRAZolam  (XANAX ) 0.5 MG tablet, TAKE ONE TABLET BY MOUTH AT BEDTIME AS NEEDED FOR ANXIETY, Disp: 30 tablet, Rfl: 0   amoxicillin  (AMOXIL ) 500 MG capsule, Take 2 capsules (1,000 mg total) by mouth 2 (two) times daily., Disp: 40 capsule, Rfl: 0   cetirizine (ZYRTEC) 10 MG tablet, Take 10 mg by mouth as needed for allergies. Reported on 02/14/2016, Disp: , Rfl:    Cholecalciferol  (VITAMIN D ) 125 MCG (5000 UT) CAPS, Take 1 tablet by mouth daily., Disp: ,  Rfl:    ibuprofen (ADVIL,MOTRIN) 200 MG tablet, Take 400 mg by mouth every 6 (six) hours as needed for moderate pain., Disp: , Rfl:    predniSONE  (DELTASONE ) 10 MG tablet, 3 tabs by mouth daily x 3 days, then 2 tabs by mouth daily x 2 days then 1 tab by mouth daily x 2 days, Disp: 15 tablet, Rfl: 0   Observations/Objective: Height 5' 7 (1.702 m), weight 192 lb (87.1 kg).  Physical Exam Constitutional:      General: The patient is not in acute distress. Pulmonary:     Effort: Pulmonary effort is normal. No respiratory distress.  Neurological:     Mental Status: The patient is  alert and oriented to person, place, and time.  Psychiatric:        Mood and Affect: Mood normal.        Behavior: Behavior normal.    Assessment and Plan Acute non-recurrent maxillary sinusitis Assessment & Plan: Acute, left ear pain and facial pain.  Given ongoing greater than 1 week, will treat for possible initial allergic sinusitis now with concern for bacterial superinfection. Start Zyrtec at bedtime.  Complete prednisone  taper (patient has requested lower dose to avoid side effects) 30 mg / 20 mg/10 mg over 7 days. Start amoxicillin  500 mg 2 capsules twice daily x 10 days. Continue nasal saline irrigation.  Return and ER precautions provided   Other orders -     Amoxicillin ; Take 2 capsules (1,000 mg total) by mouth 2 (two) times daily.  Dispense: 40 capsule; Refill: 0 -     predniSONE ; 3 tabs by mouth daily x 3 days, then 2 tabs by mouth daily x 2 days then 1 tab by mouth daily x 2 days  Dispense: 15 tablet; Refill: 0      I discussed the assessment and treatment plan with the patient. The patient was provided an opportunity to ask questions and all were answered. The patient agreed with the plan and demonstrated an understanding of the instructions.   The patient was advised to call back or seek an in-person evaluation if the symptoms worsen or if the condition fails to improve as anticipated.     Greig Ring, MD

## 2024-05-12 NOTE — Assessment & Plan Note (Signed)
 Acute, left ear pain and facial pain.  Given ongoing greater than 1 week, will treat for possible initial allergic sinusitis now with concern for bacterial superinfection. Start Zyrtec at bedtime.  Complete prednisone  taper (patient has requested lower dose to avoid side effects) 30 mg / 20 mg/10 mg over 7 days. Start amoxicillin  500 mg 2 capsules twice daily x 10 days. Continue nasal saline irrigation.  Return and ER precautions provided

## 2024-05-13 ENCOUNTER — Ambulatory Visit: Admitting: Family Medicine

## 2024-07-29 ENCOUNTER — Ambulatory Visit: Admitting: Family Medicine

## 2024-07-29 ENCOUNTER — Encounter: Payer: Self-pay | Admitting: Family Medicine

## 2024-07-29 ENCOUNTER — Ambulatory Visit: Payer: Self-pay | Admitting: Family Medicine

## 2024-07-29 VITALS — BP 120/86 | HR 78 | Temp 98.0°F | Ht 67.0 in | Wt 186.0 lb

## 2024-07-29 DIAGNOSIS — E7800 Pure hypercholesterolemia, unspecified: Secondary | ICD-10-CM | POA: Diagnosis not present

## 2024-07-29 DIAGNOSIS — K5900 Constipation, unspecified: Secondary | ICD-10-CM

## 2024-07-29 DIAGNOSIS — R109 Unspecified abdominal pain: Secondary | ICD-10-CM

## 2024-07-29 DIAGNOSIS — N912 Amenorrhea, unspecified: Secondary | ICD-10-CM | POA: Diagnosis not present

## 2024-07-29 DIAGNOSIS — N926 Irregular menstruation, unspecified: Secondary | ICD-10-CM

## 2024-07-29 DIAGNOSIS — Z131 Encounter for screening for diabetes mellitus: Secondary | ICD-10-CM

## 2024-07-29 LAB — POC URINALSYSI DIPSTICK (AUTOMATED)
Bilirubin, UA: NEGATIVE
Glucose, UA: NEGATIVE
Ketones, UA: NEGATIVE
Leukocytes, UA: NEGATIVE
Nitrite, UA: NEGATIVE
Protein, UA: NEGATIVE
Spec Grav, UA: 1.015 (ref 1.010–1.025)
Urobilinogen, UA: 0.2 U/dL — AB
pH, UA: 6 (ref 5.0–8.0)

## 2024-07-29 LAB — COMPREHENSIVE METABOLIC PANEL WITH GFR
ALT: 11 U/L (ref 0–35)
AST: 13 U/L (ref 0–37)
Albumin: 3.9 g/dL (ref 3.5–5.2)
Alkaline Phosphatase: 49 U/L (ref 39–117)
BUN: 12 mg/dL (ref 6–23)
CO2: 26 meq/L (ref 19–32)
Calcium: 9.1 mg/dL (ref 8.4–10.5)
Chloride: 107 meq/L (ref 96–112)
Creatinine, Ser: 0.63 mg/dL (ref 0.40–1.20)
GFR: 105.14 mL/min (ref >60.00)
Glucose, Bld: 88 mg/dL (ref 70–99)
Potassium: 3.8 meq/L (ref 3.5–5.1)
Sodium: 140 meq/L (ref 135–145)
Total Bilirubin: 0.5 mg/dL (ref 0.2–1.2)
Total Protein: 7 g/dL (ref 6.0–8.3)

## 2024-07-29 LAB — CBC WITH DIFFERENTIAL/PLATELET
Basophils Absolute: 0 K/uL (ref 0.0–0.1)
Basophils Relative: 0.6 % (ref 0.0–3.0)
Eosinophils Absolute: 0.1 K/uL (ref 0.0–0.7)
Eosinophils Relative: 3.5 % (ref 0.0–5.0)
HCT: 38.1 % (ref 36.0–46.0)
Hemoglobin: 12.8 g/dL (ref 12.0–15.0)
Lymphocytes Relative: 32.4 % (ref 12.0–46.0)
Lymphs Abs: 1.2 K/uL (ref 0.7–4.0)
MCHC: 33.5 g/dL (ref 30.0–36.0)
MCV: 93 fl (ref 78.0–100.0)
Monocytes Absolute: 0.4 K/uL (ref 0.1–1.0)
Monocytes Relative: 9.7 % (ref 3.0–12.0)
Neutro Abs: 2 K/uL (ref 1.4–7.7)
Neutrophils Relative %: 53.8 % (ref 43.0–77.0)
Platelets: 243 K/uL (ref 150.0–400.0)
RBC: 4.1 Mil/uL (ref 3.87–5.11)
RDW: 12.4 % (ref 11.5–15.5)
WBC: 3.7 K/uL — ABNORMAL LOW (ref 4.0–10.5)

## 2024-07-29 LAB — HEMOGLOBIN A1C: Hgb A1c MFr Bld: 5.5 % (ref 4.6–6.5)

## 2024-07-29 LAB — LUTEINIZING HORMONE: LH: 26.43 m[IU]/mL

## 2024-07-29 LAB — LIPID PANEL
Cholesterol: 147 mg/dL (ref 0–200)
HDL: 48.8 mg/dL (ref >39.00)
LDL Cholesterol: 90 mg/dL (ref 0–99)
NonHDL: 98.62
Total CHOL/HDL Ratio: 3
Triglycerides: 42 mg/dL (ref 0.0–149.0)
VLDL: 8.4 mg/dL (ref 0.0–40.0)

## 2024-07-29 LAB — TSH: TSH: 0.69 u[IU]/mL (ref 0.35–5.50)

## 2024-07-29 LAB — POCT URINE PREGNANCY: Preg Test, Ur: NEGATIVE

## 2024-07-29 LAB — FOLLICLE STIMULATING HORMONE: FSH: 35.8 m[IU]/mL

## 2024-07-29 NOTE — Assessment & Plan Note (Signed)
 Typical symptom associated with menstrual cycle.  Encourage patient to increase water and fiber in diet.  Can use like MiraLAX or stool softener as needed for treatment.

## 2024-07-29 NOTE — Progress Notes (Signed)
 Patient ID: Victoria Gilmore, female    DOB: [birth year], 48 y.o.   MRN: [id number]  This visit was conducted in person.  BP 120/86   Pulse 78   Temp 98 F (36.7 C) (Temporal)   Ht 5' 7 (1.702 m)   Wt 186 lb (84.4 kg)   SpO2 98%   BMI 29.13 kg/m    CC:  Chief Complaint  Patient presents with   Hot Flashes   Amenorrhea    No period in September Light Period in August   Abdominal Cramping    Subjective:   HPI: Victoria Gilmore is a 48 y.o. female presenting on 07/29/2024 for Hot Flashes, Amenorrhea (No period in September/Light Period in August), and Abdominal Cramping    New onset amenorrhea in last month with light menses the month before.  She has noted irregular menses in the last  1-2 years... fluctuating flow, occ shorter. Mom went through menopause late 53s.       She has been feeling foogy headed although well rested.  Now in last month she has been having hot flashes and  sweaty/clammy... several times a day.  Wakes sweating at night. Able to go back to sleep.  No significant mood.   Typically has constipation prior to menses, low abdomen cramping... she has felt like that in the last month... having BMs daily to every other day. No blood in stool.  No  dysuria frequency, urgency.   She is sexually active. No new partners.   Drinking water and eating a lot fiber... occ softner.  Relevant past medical, surgical, family and social history reviewed and updated as indicated. Interim medical history since our last visit reviewed. Allergies and medications reviewed and updated. Outpatient Medications Prior to Visit  Medication Sig Dispense Refill   ALPRAZolam  (XANAX ) 0.5 MG tablet TAKE ONE TABLET BY MOUTH AT BEDTIME AS NEEDED FOR ANXIETY 30 tablet 0   cetirizine (ZYRTEC) 10 MG tablet Take 10 mg by mouth as needed for allergies. Reported on 02/14/2016     ibuprofen (ADVIL,MOTRIN) 200 MG tablet Take 400 mg by mouth every 6 (six) hours as needed for moderate  pain.     amoxicillin  (AMOXIL ) 500 MG capsule Take 2 capsules (1,000 mg total) by mouth 2 (two) times daily. 40 capsule 0   Cholecalciferol  (VITAMIN D ) 125 MCG (5000 UT) CAPS Take 1 tablet by mouth daily.     predniSONE  (DELTASONE ) 10 MG tablet 3 tabs by mouth daily x 3 days, then 2 tabs by mouth daily x 2 days then 1 tab by mouth daily x 2 days 15 tablet 0   No facility-administered medications prior to visit.     Per HPI unless specifically indicated in ROS section below Review of Systems  Constitutional:  Negative for fatigue and fever.  HENT:  Negative for congestion.   Eyes:  Negative for pain.  Respiratory:  Negative for cough and shortness of breath.   Cardiovascular:  Negative for chest pain, palpitations and leg swelling.  Gastrointestinal:  Positive for abdominal pain and constipation.  Genitourinary:  Positive for menstrual problem. Negative for dysuria and vaginal bleeding.  Musculoskeletal:  Negative for back pain.  Neurological:  Negative for syncope, light-headedness and headaches.  Psychiatric/Behavioral:  Negative for dysphoric mood.    Objective:  BP 120/86   Pulse 78   Temp 98 F (36.7 C) (Temporal)   Ht 5' 7 (1.702 m)   Wt 186 lb (84.4 kg)   SpO2 98%  BMI 29.13 kg/m   Wt Readings from Last 3 Encounters:  07/29/24 186 lb (84.4 kg)  05/12/24 192 lb (87.1 kg)  11/27/23 193 lb (87.5 kg)      Physical Exam Constitutional:      General: She is not in acute distress.    Appearance: Normal appearance. She is well-developed. She is not ill-appearing or toxic-appearing.  HENT:     Head: Normocephalic.     Right Ear: Hearing, tympanic membrane, ear canal and external ear normal. Tympanic membrane is not erythematous, retracted or bulging.     Left Ear: Hearing, tympanic membrane, ear canal and external ear normal. Tympanic membrane is not erythematous, retracted or bulging.     Nose: No mucosal edema or rhinorrhea.     Right Sinus: No maxillary sinus  tenderness or frontal sinus tenderness.     Left Sinus: No maxillary sinus tenderness or frontal sinus tenderness.     Mouth/Throat:     Pharynx: Uvula midline.  Eyes:     General: Lids are normal. Lids are everted, no foreign bodies appreciated.     Conjunctiva/sclera: Conjunctivae normal.     Pupils: Pupils are equal, round, and reactive to light.  Neck:     Thyroid : No thyroid  mass or thyromegaly.     Vascular: No carotid bruit.     Trachea: Trachea normal.  Cardiovascular:     Rate and Rhythm: Normal rate and regular rhythm.     Pulses: Normal pulses.     Heart sounds: Normal heart sounds, S1 normal and S2 normal. No murmur heard.    No friction rub. No gallop.  Pulmonary:     Effort: Pulmonary effort is normal. No tachypnea or respiratory distress.     Breath sounds: Normal breath sounds. No decreased breath sounds, wheezing, rhonchi or rales.  Abdominal:     General: Bowel sounds are normal.     Palpations: Abdomen is soft.     Tenderness: There is no abdominal tenderness.  Musculoskeletal:     Cervical back: Normal range of motion and neck supple.  Skin:    General: Skin is warm and dry.     Findings: No rash.  Neurological:     Mental Status: She is alert.  Psychiatric:        Mood and Affect: Mood is not anxious or depressed.        Speech: Speech normal.        Behavior: Behavior normal. Behavior is cooperative.        Thought Content: Thought content normal.        Judgment: Judgment normal.       Results for orders placed or performed in visit on 11/27/23  Cytology - PAP([place])   Collection Time: 11/27/23  9:39 AM  Result Value Ref Range   High risk HPV Negative    Adequacy      Satisfactory for evaluation; transformation zone component PRESENT.   Diagnosis      - Negative for Intraepithelial Lesions or Malignancy (NILM)   Diagnosis - Benign reactive/reparative changes    Comment Normal Reference Range HPV - Negative   VITAMIN D  25 Hydroxy (Vit-D  Deficiency, Fractures)   Collection Time: 11/27/23  9:46 AM  Result Value Ref Range   VITD 34.13 30.00 - 100.00 ng/mL  Basic Metabolic Panel   Collection Time: 11/27/23  9:46 AM  Result Value Ref Range   Sodium 140 135 - 145 mEq/L   Potassium 4.6 3.5 - 5.1  mEq/L   Chloride 105 96 - 112 mEq/L   CO2 27 19 - 32 mEq/L   Glucose, Bld 81 70 - 99 mg/dL   BUN 14 6 - 23 mg/dL   Creatinine, Ser 9.31 0.40 - 1.20 mg/dL   GFR 896.28 >39.99 mL/min   Calcium 9.2 8.4 - 10.5 mg/dL    Assessment and Plan  There are no diagnoses linked to this encounter.  No follow-ups on file.   Greig Ring, MD

## 2024-07-29 NOTE — Assessment & Plan Note (Signed)
 Acute... likely associated with perimenopause and /or constipation.  Urinalysis normal.  Eval with labs and encouraged increase fiber and water in diet.  Can use stool softner or Miralax if needed.

## 2024-07-29 NOTE — Assessment & Plan Note (Addendum)
 Acute, negative urine pregnancy test.  Symptoms likely perimenopausal.  Discussed treatment options and healthy lifestyle to manage symptoms.  Will eval with labs for hormone levels and for other secondary cause such as thyroid  issue.

## 2024-07-29 NOTE — Assessment & Plan Note (Signed)
 Borderline high cholesterol in apst, DUE for re-eval , Encouraged exercise, weight loss, healthy eating habits.

## 2024-07-30 LAB — ESTRADIOL: Estradiol: 68 pg/mL
